# Patient Record
Sex: Female | Born: 1940 | Race: White | Hispanic: No | Marital: Married | Smoking: Never smoker
Health system: Southern US, Community
[De-identification: ages and names within clinical notes are randomized; demographics above are authoritative.]

## PROBLEM LIST (undated history)

## (undated) DIAGNOSIS — N3289 Other specified disorders of bladder: Secondary | ICD-10-CM

## (undated) DIAGNOSIS — R9431 Abnormal electrocardiogram [ECG] [EKG]: Secondary | ICD-10-CM

## (undated) DIAGNOSIS — I10 Essential (primary) hypertension: Secondary | ICD-10-CM

## (undated) DIAGNOSIS — R32 Unspecified urinary incontinence: Secondary | ICD-10-CM

## (undated) DIAGNOSIS — M5416 Radiculopathy, lumbar region: Secondary | ICD-10-CM

## (undated) DIAGNOSIS — Z8619 Personal history of other infectious and parasitic diseases: Secondary | ICD-10-CM

## (undated) DIAGNOSIS — Z79899 Other long term (current) drug therapy: Secondary | ICD-10-CM

## (undated) DIAGNOSIS — M8589 Other specified disorders of bone density and structure, multiple sites: Secondary | ICD-10-CM

## (undated) DIAGNOSIS — K5900 Constipation, unspecified: Secondary | ICD-10-CM

## (undated) DIAGNOSIS — K222 Esophageal obstruction: Secondary | ICD-10-CM

## (undated) DIAGNOSIS — R269 Unspecified abnormalities of gait and mobility: Secondary | ICD-10-CM

## (undated) DIAGNOSIS — K4020 Bilateral inguinal hernia, without obstruction or gangrene, not specified as recurrent: Secondary | ICD-10-CM

## (undated) DIAGNOSIS — M545 Low back pain, unspecified: Secondary | ICD-10-CM

## (undated) DIAGNOSIS — N183 Chronic kidney disease, stage 3 unspecified: Secondary | ICD-10-CM

## (undated) DIAGNOSIS — I499 Cardiac arrhythmia, unspecified: Secondary | ICD-10-CM

## (undated) DIAGNOSIS — G35 Multiple sclerosis: Secondary | ICD-10-CM

## (undated) DIAGNOSIS — N952 Postmenopausal atrophic vaginitis: Secondary | ICD-10-CM

## (undated) DIAGNOSIS — M47816 Spondylosis without myelopathy or radiculopathy, lumbar region: Secondary | ICD-10-CM

## (undated) DIAGNOSIS — G890 Central pain syndrome: Secondary | ICD-10-CM

## (undated) DIAGNOSIS — R002 Palpitations: Secondary | ICD-10-CM

## (undated) HISTORY — PX: CARPAL TUNNEL RELEASE: SHX101

## (undated) HISTORY — PX: TUBAL LIGATION: SHX77

## (undated) HISTORY — PX: SPINE SURGERY: SHX786

## (undated) HISTORY — PX: TONSILLECTOMY: SUR1361

---

## 2016-07-05 ENCOUNTER — Emergency Department
Admission: EM | Admit: 2016-07-05 | Discharge: 2016-07-05 | Disposition: A | Discharge status: Home / Self Care | Payer: Medicare Other | Attending: Emergency Medicine | Admitting: Emergency Medicine

## 2016-07-05 ENCOUNTER — Encounter: Payer: Self-pay | Admitting: Emergency Medicine

## 2016-07-05 ENCOUNTER — Emergency Department: Payer: Medicare Other

## 2016-07-05 DIAGNOSIS — W19XXXA Unspecified fall, initial encounter: Secondary | ICD-10-CM

## 2016-07-05 DIAGNOSIS — Y999 Unspecified external cause status: Secondary | ICD-10-CM | POA: Insufficient documentation

## 2016-07-05 DIAGNOSIS — W1839XA Other fall on same level, initial encounter: Secondary | ICD-10-CM | POA: Diagnosis not present

## 2016-07-05 DIAGNOSIS — Y939 Activity, unspecified: Secondary | ICD-10-CM | POA: Diagnosis not present

## 2016-07-05 DIAGNOSIS — Y92009 Unspecified place in unspecified non-institutional (private) residence as the place of occurrence of the external cause: Secondary | ICD-10-CM

## 2016-07-05 DIAGNOSIS — Y92012 Bathroom of single-family (private) house as the place of occurrence of the external cause: Secondary | ICD-10-CM | POA: Diagnosis not present

## 2016-07-05 DIAGNOSIS — S299XXA Unspecified injury of thorax, initial encounter: Secondary | ICD-10-CM | POA: Diagnosis present

## 2016-07-05 DIAGNOSIS — S20221A Contusion of right back wall of thorax, initial encounter: Secondary | ICD-10-CM | POA: Diagnosis not present

## 2016-07-05 HISTORY — DX: Low back pain, unspecified: M54.50

## 2016-07-05 HISTORY — DX: Low back pain: M54.5

## 2016-07-05 HISTORY — DX: Cardiac arrhythmia, unspecified: I49.9

## 2016-07-05 HISTORY — DX: Other specified disorders of bladder: N32.89

## 2016-07-05 HISTORY — DX: Multiple sclerosis: G35

## 2016-07-05 MED ORDER — HYDROCODONE-ACETAMINOPHEN 5-325 MG PO TABS
2.0000 | ORAL_TABLET | Freq: Once | ORAL | Status: AC
  Administered 2016-07-05: 2 via ORAL
  Filled 2016-07-05: qty 2

## 2016-07-05 NOTE — ED Provider Notes (Signed)
[facility] Emergency Department Provider Note  ____________________________________________  Time seen: Approximately 10:30 AM  I have reviewed the triage vital signs and the nursing notes.   HISTORY  Chief Complaint Fall    HPI Peggy Mclaughlin is a 76 y.o. female , NAD, presents to the emergency department accompanied by her son who assists with history. Patient states she fell backwards onto her toilet this morning. States she has multiple sclerosis and chronic lower back pain which causes her legs to be weak. Unfortunately did not have safety precautions in place in her current bathroom as she just recently moved in to a new apartment over the last couple of days. Denies head injury, loss of consciousness, visual changes, numbness or tingling. Did not hit her head and denies any neck pain at this time. States she hit the right middle back and shoulder area onto the edge of the wound. Has not noted any bruising, swelling or bleeding. Her son states that she has had no slurred speech or changes in gait since being in his presence.She is on chronic pain medication as prescribed by pain management Center in [facility] but did not take any pain medication this morning. Denies any saddle paresthesias no loss of bowel or bladder control. Denies chest pain, shortness breath, abdominal pain, nausea or vomiting.   Past Medical History:  Diagnosis Date  . Bladder spasms   . Irregular heart beat   . Lower back pain   . Multiple sclerosis (HCC)     There are no active problems to display for this patient.   Past Surgical History:  Procedure Laterality Date  . CARPAL TUNNEL RELEASE    . SPINE SURGERY     lumbar and thoracic  . TONSILLECTOMY    . TUBAL LIGATION      Prior to Admission medications   Not on File    Allergies Aspirin; Demerol [meperidine]; and Adhesive [tape]  No family history on file.  Social History Social History   Substance Use Topics  . Smoking status: Never Smoker  . Smokeless tobacco: Never Used  . Alcohol use No     Review of Systems  Constitutional: No fever/chills Eyes: No visual changes.  Cardiovascular: No chest pain. Respiratory: No shortness of breath. No wheezing.  Gastrointestinal: No abdominal pain.  No nausea, vomiting.  Musculoskeletal:Positive for right-sided middle back pain. Negative for neck or lower back pain. Skin: Negative for rash, Redness, swelling, bruising, open wounds or lacerations. Neurological: Negative for headaches, focal weakness or numbness.No tingling. No loss of consciousness, dizziness, lightheadedness. No saddle paresthesias or loss of bowel or bladder control 10-point ROS otherwise negative.  ____________________________________________   PHYSICAL EXAM:  VITAL SIGNS: ED Triage Vitals  Enc Vitals Group     BP 07/05/16 0931 132/75     Pulse Rate 07/05/16 0931 (!) 49     Resp 07/05/16 0931 20     Temp 07/05/16 0931 97.7 F (36.5 C)     Temp Source 07/05/16 0931 Oral     SpO2 07/05/16 0931 98 %     Weight 07/05/16 0931 195 lb (88.5 kg)     Height 07/05/16 0931 5\' 4"  (1.626 m)     Head Circumference --      Peak Flow --      Pain Score 07/05/16 0953 9     Pain Loc --      Pain Edu? --      Excl. in GC? --  Constitutional: Alert and oriented. Well appearing and in no acute distress. Eyes: Conjunctivae are normal.  Head: Atraumatic. Neck: Supple with full range of motion. No cervical spine tenderness to palpation. No trapezial muscle spasms. Hematological/Lymphatic/Immunilogical: No cervical lymphadenopathy. Cardiovascular: Normal rate, regular rhythm. Normal S1 and S2.  Good peripheral circulation. Respiratory: Normal respiratory effort without tachypnea or retractions. Lungs CTAB with breath sounds noted in all lung fields. No wheeze, rhonchi, rales Musculoskeletal: Tenderness to palpation about the right scapula without any bony  abnormalities or crepitus. Full range of motion of the right upper extremity without difficulty. Tenderness to palpation about the right, mid posterior rib cage without crepitus or bony abnormality. No midline thoracic or lumbar spinal tenderness. No lower extremity tenderness nor edema.  No joint effusions. Neurologic:  Normal speech and language. No gross focal neurologic deficits are appreciated.  Skin:  Skin is warm, dry and intact. No rash, redness, swelling, bruising, open wounds or lacerations noted. Psychiatric: Mood and affect are normal. Speech and behavior are normal. Patient exhibits appropriate insight and judgement.   ____________________________________________   LABS  None ____________________________________________  EKG  None ____________________________________________  RADIOLOGY I, Hope Pigeon, personally viewed and evaluated these images (plain radiographs) as part of my medical decision making, as well as reviewing the written report by the radiologist.  Dg Ribs Unilateral W/chest Right  Result Date: 07/05/2016 CLINICAL DATA:  Right rib pain after fall at home. EXAM: RIGHT RIBS AND CHEST - 3+ VIEW COMPARISON:  None. FINDINGS: No fracture or other bone lesions are seen involving the ribs. There is no evidence of pneumothorax or pleural effusion. Both lungs are clear. Heart size and mediastinal contours are within normal limits. IMPRESSION: Normal right ribs.  No acute cardiopulmonary abnormality seen. Electronically Signed   By: Lupita Raider, M.D.   On: 07/05/2016 11:22   Dg Scapula Right  Result Date: 07/05/2016 CLINICAL DATA:  Right scapula pain after fall at home today. EXAM: RIGHT SCAPULA - 2+ VIEWS COMPARISON:  None. FINDINGS: There is no evidence of fracture or other focal bone lesions. Soft tissues are unremarkable. IMPRESSION: Normal right scapula. Electronically Signed   By: Lupita Raider, M.D.   On: 07/05/2016 11:24     ____________________________________________    PROCEDURES  Procedure(s) performed: None   Procedures   Medications  HYDROcodone-acetaminophen (NORCO/VICODIN) 5-325 MG per tablet 2 tablet (2 tablets Oral Given 07/05/16 1215)     ____________________________________________   INITIAL IMPRESSION / ASSESSMENT AND PLAN / ED COURSE  Pertinent labs & imaging results that were available during my care of the patient were reviewed by me and considered in my medical decision making (see chart for details).  Clinical Course as of Jul 06 1411  Thu Jul 05, 2016  1041 Patient changed into a gown and transferred from wheelchair to bed.   [JH]  1050 Patient transported to xray   [JH]  [address] [place] Controlled substance database accessed and reviewed. The patient receives #60 tablets of Norco 10 mg monthly from [place], [place]. Last prescription was dispensed on 12/29/ 2017 to last for 30 days.  [JH]    Clinical Course User Index [JH] Shantese Raven L Basya Casavant, PA-C    Patient's diagnosis is consistent with Contusion of the right back due to falling at home. Patient's son at the bedside states that they're the process of applying handrails for the patient using a new home. Patient does have a bedside urinal in which she can use over the next few  days to limit significant ambulation. Patient advised to complete light range of motion and stretching as tolerated. Patient advised against any complete rest and should move about her home as she feels comfortable. Patient will be discharged home with instructions to take over-the-counter Tylenol arthritis strength in conjunction with her chronic pain medication as needed. Patient advised against taking any more than 2400-3000 mg of Tylenol in a 24-hour period and should count the acetaminophen that is in her chronic pain medications. Patient may contact her medical provider at the pain management Center to discuss any increase in acute pain management. Patient also  given information for [facility] in which she can call to schedule an appointment to establish care for chronic medical needs.  Patient is given ED precautions to return to the ED for any worsening or new symptoms.    ____________________________________________  FINAL CLINICAL IMPRESSION(S) / ED DIAGNOSES  Final diagnoses:  Contusion, back, right, initial encounter  Fall in home, initial encounter      NEW MEDICATIONS STARTED DURING THIS VISIT:  There are no discharge medications for this patient.        Hope Pigeon, PA-C 07/05/16 1413    Emily Filbert, MD 07/05/16 [phone]

## 2016-07-05 NOTE — ED Triage Notes (Addendum)
Fell in bathroom this morning, hit right mid back on side of commode.  C/O right back pain.  Patient states she and her husband recently moved into home and have scheduled installation of handicap bars.  Patient has history of MS.

## 2016-07-05 NOTE — ED Notes (Addendum)
See triage note   States she fell while getting off commode  Hit right mid back   Denies any other injury

## 2016-07-05 NOTE — ED Notes (Signed)
Patient waiting on ride with her clothes to be discharged.

## 2016-08-11 ENCOUNTER — Ambulatory Visit
Admission: EM | Admit: 2016-08-11 | Discharge: 2016-08-11 | Disposition: A | Discharge status: Home / Self Care | Payer: Medicare Other | Attending: Family Medicine | Admitting: Family Medicine

## 2016-08-11 ENCOUNTER — Encounter: Payer: Self-pay | Admitting: *Deleted

## 2016-08-11 DIAGNOSIS — Z882 Allergy status to sulfonamides status: Secondary | ICD-10-CM | POA: Insufficient documentation

## 2016-08-11 DIAGNOSIS — Z880 Allergy status to penicillin: Secondary | ICD-10-CM | POA: Insufficient documentation

## 2016-08-11 DIAGNOSIS — R32 Unspecified urinary incontinence: Secondary | ICD-10-CM | POA: Insufficient documentation

## 2016-08-11 DIAGNOSIS — G35 Multiple sclerosis: Secondary | ICD-10-CM | POA: Insufficient documentation

## 2016-08-11 DIAGNOSIS — N390 Urinary tract infection, site not specified: Secondary | ICD-10-CM | POA: Insufficient documentation

## 2016-08-11 LAB — URINALYSIS, COMPLETE (UACMP) WITH MICROSCOPIC
Bilirubin Urine: NEGATIVE
GLUCOSE, UA: NEGATIVE mg/dL
HGB URINE DIPSTICK: NEGATIVE
Ketones, ur: NEGATIVE mg/dL
Nitrite: POSITIVE — AB
PH: 7 (ref 5.0–8.0)
Protein, ur: NEGATIVE mg/dL
RBC / HPF: NONE SEEN RBC/hpf (ref 0–5)
Specific Gravity, Urine: 1.015 (ref 1.005–1.030)

## 2016-08-11 MED ORDER — CIPROFLOXACIN HCL 500 MG PO TABS: 500.0000 mg | ORAL_TABLET | Freq: Two times a day (BID) | ORAL | 0 refills | Status: AC

## 2016-08-11 NOTE — Discharge Instructions (Signed)
Take medication as prescribed. Rest. Drink plenty of fluids.   Follow up with your primary care physician this week as discussed. Follow up with your urologist. Return to Urgent care for new or worsening concerns.

## 2016-08-11 NOTE — ED Triage Notes (Signed)
Patient started having symptoms of burning on urination, urinary incontinence, and urine odor for 1.5 weeks. Patient has a history of UTI.

## 2016-08-11 NOTE — ED Provider Notes (Signed)
[facility] ____________________________________________  Time seen: Approximately 1150 am  I have reviewed the triage vital signs and the nursing notes.   HISTORY  Chief Complaint Urinary Incontinence   HPI Peggy Mclaughlin is a 76 y.o. female presenting for evaluation of foul smelling urine, burning with urination, urinary frequency and occasional urinary incontinence for the last week. Patient reports similar presentation and past with urinary tract infections. Patient reports that she does have a history of MS, and reports is often in wheelchair. Reports she has been having similar symptoms of UTIs that have increased in frequency over the last year. Denies known triggers. Reports has been previously seen by urology.   Reports last urinary tract infection was approximately 2 months ago. In discussing with patient, care everywhere reviewed and noted that patient did have a positive UTI with culture showing Klebsiella that was resistant to ampicillin and nitrofurantoin. Urine culture at that time showed sensitivities to oral treatment Cipro, cephalexin, Bactrim. Patient reports penicillin and sulfa allergic. Patient reports tongue issues when taking penicillins, Denies tongue swelling.   patient reports that she has chronic low back pain, denies acute changes. Reports continues to eat and drink well. Denies nausea, vomiting, diarrhea, constipation, vaginal complaints. Patient reports last creatinine was 1.3.    Past Medical History:  Diagnosis Date  . Bladder spasms   . Irregular heart beat   . Lower back pain   . Multiple sclerosis (HCC)     There are no active problems to display for this patient.   Past Surgical History:  Procedure Laterality Date  . CARPAL TUNNEL RELEASE    . SPINE SURGERY     lumbar and thoracic  . TONSILLECTOMY    . TUBAL LIGATION       No current facility-administered medications for this encounter.   Current Outpatient  Prescriptions:  .  benazepril (LOTENSIN) 40 MG tablet, Take 40 mg by mouth daily., Disp: , Rfl:  .  FLUoxetine (PROZAC) 20 MG tablet, Take 20 mg by mouth daily., Disp: , Rfl:  .  fluticasone (FLONASE) 50 MCG/ACT nasal spray, Place 2 sprays into both nostrils daily., Disp: , Rfl:  .  HYDROcodone-acetaminophen (NORCO) 10-325 MG tablet, Take 1 tablet by mouth daily after breakfast., Disp: , Rfl:  .  hyoscyamine (LEVSIN, ANASPAZ) 0.125 MG tablet, Take 0.125 mg by mouth daily., Disp: , Rfl:  .  spironolactone (ALDACTONE) 25 MG tablet, Take 25 mg by mouth daily., Disp: , Rfl:  .  ciprofloxacin (CIPRO) 500 MG tablet, Take 1 tablet (500 mg total) by mouth 2 (two) times daily., Disp: 14 tablet, Rfl: 0 .  estradiol (ESTRACE) 0.1 MG/GM vaginal cream, Place 1 Applicatorful vaginally at bedtime., Disp: , Rfl:   Allergies Amlodipine; Ampicillin; Sulfa antibiotics; Aspirin; Demerol [meperidine]; Adhesive [tape]; Baclofen; Simvastatin; and Tapentadol  History reviewed. No pertinent family history.  Social History Social History  Substance Use Topics  . Smoking status: Never Smoker  . Smokeless tobacco: Never Used  . Alcohol use No    Review of Systems Constitutional: No fever/chills Cardiovascular: Denies chest pain. Respiratory: Denies shortness of breath. Gastrointestinal: No abdominal pain.  No nausea, no vomiting.  No diarrhea.  No constipation. Genitourinary: Positive for dysuria. Musculoskeletal: As above.  Skin: Negative for rash. Neurological: Negative for focal weakness or numbness.  10-point ROS otherwise negative.  ____________________________________________   PHYSICAL EXAM:  VITAL SIGNS: ED Triage Vitals  Enc Vitals Group     BP 08/11/16 1048 136/88  Pulse Rate 08/11/16 1048 60     Resp 08/11/16 1048 16     Temp 08/11/16 1048 98.3 F (36.8 C)     Temp Source 08/11/16 1048 Oral     SpO2 08/11/16 1048 99 %     Weight 08/11/16 1049 190 lb (86.2 kg)     Height 08/11/16  1049 5\' 4"  (1.626 m)     Head Circumference --      Peak Flow --      Pain Score 08/11/16 1057 0     Pain Loc --      Pain Edu? --      Excl. in GC? --     Constitutional: Alert and oriented. Well appearing and in no acute distress. Eyes: Conjunctivae are normal. PERRL. EOMI. ENT      Head: Normocephalic and atraumatic. Hematological/Lymphatic/Immunilogical: No cervical lymphadenopathy. Cardiovascular: Normal rate, regular rhythm. Grossly normal heart sounds.  Good peripheral circulation. Respiratory: Normal respiratory effort without tachypnea nor retractions. Breath sounds are clear and equal bilaterally. No wheezes, rales, rhonchi. Gastrointestinal: Soft and nontender. No distention. No CVA tenderness. Neurologic:  Normal speech and language. Speech is normal.  Skin:  Skin is warm, dry Psychiatric: Mood and affect are normal. Speech and behavior are normal. Patient exhibits appropriate insight and judgment   ___________________________________________   LABS (all labs ordered are listed, but only abnormal results are displayed)  Labs Reviewed  URINALYSIS, COMPLETE (UACMP) WITH MICROSCOPIC - Abnormal; Notable for the following:       Result Value   Color, Urine STRAW (*)    APPearance HAZY (*)    Nitrite POSITIVE (*)    Leukocytes, UA TRACE (*)    Squamous Epithelial / LPF 6-30 (*)    Bacteria, UA MANY (*)    All other components within normal limits  URINE CULTURE     PROCEDURES Procedures    INITIAL IMPRESSION / ASSESSMENT AND PLAN / ED COURSE  Pertinent labs & imaging results that were available during my care of the patient were reviewed by me and considered in my medical decision making (see chart for details).  Well-appearing patient. No acute distress. Urinalysis reviewed, suspect urinary tract infection. In discussing patient interview and most recent urine culture sensitivities, discussed with patient we'll treat patient with oral Cipro, as patient denies  previously taking cephalexin and unsure as far as tongue involvement with penicillin allergy. Will culture urine. Discussed in detail patient supportive care as well as follow-up with primary care physician or urology in the next week. Discussed strict follow-up and return parameters. Discussed indication, risks (including tendon issues) and benefits of medications with patient.  Discussed follow up with Primary care physician this week. Discussed follow up and return parameters including no resolution or any worsening concerns. Patient verbalized understanding and agreed to plan.   ____________________________________________   FINAL CLINICAL IMPRESSION(S) / ED DIAGNOSES  Final diagnoses:  Urinary tract infection without hematuria, site unspecified     Discharge Medication List as of 08/11/2016 12:10 PM    START taking these medications   Details  ciprofloxacin (CIPRO) 500 MG tablet Take 1 tablet (500 mg total) by mouth 2 (two) times daily., Starting Sat 08/11/2016, Until Sat 08/18/2016, Normal        Note: This dictation was prepared with Dragon dictation along with smaller phrase technology. Any transcriptional errors that result from this process are unintentional.         Renford Dills, NP 08/11/16 1255

## 2016-08-14 LAB — URINE CULTURE
Culture: >=100000 — AB
SPECIAL REQUESTS: NORMAL

## 2017-04-22 ENCOUNTER — Observation Stay
Admission: EM | Admit: 2017-04-22 | Discharge: 2017-04-23 | Disposition: A | Discharge status: Home / Self Care | Payer: Medicare Other | Attending: Internal Medicine | Admitting: Internal Medicine

## 2017-04-22 ENCOUNTER — Other Ambulatory Visit: Payer: Self-pay

## 2017-04-22 ENCOUNTER — Emergency Department: Payer: Medicare Other

## 2017-04-22 ENCOUNTER — Encounter: Payer: Self-pay | Admitting: *Deleted

## 2017-04-22 DIAGNOSIS — F329 Major depressive disorder, single episode, unspecified: Secondary | ICD-10-CM | POA: Diagnosis not present

## 2017-04-22 DIAGNOSIS — I10 Essential (primary) hypertension: Secondary | ICD-10-CM | POA: Diagnosis present

## 2017-04-22 DIAGNOSIS — I129 Hypertensive chronic kidney disease with stage 1 through stage 4 chronic kidney disease, or unspecified chronic kidney disease: Principal | ICD-10-CM | POA: Insufficient documentation

## 2017-04-22 DIAGNOSIS — I499 Cardiac arrhythmia, unspecified: Secondary | ICD-10-CM | POA: Diagnosis not present

## 2017-04-22 DIAGNOSIS — Z888 Allergy status to other drugs, medicaments and biological substances status: Secondary | ICD-10-CM | POA: Insufficient documentation

## 2017-04-22 DIAGNOSIS — N183 Chronic kidney disease, stage 3 unspecified: Secondary | ICD-10-CM | POA: Diagnosis present

## 2017-04-22 DIAGNOSIS — Z880 Allergy status to penicillin: Secondary | ICD-10-CM | POA: Insufficient documentation

## 2017-04-22 DIAGNOSIS — R51 Headache: Secondary | ICD-10-CM

## 2017-04-22 DIAGNOSIS — N3289 Other specified disorders of bladder: Secondary | ICD-10-CM | POA: Insufficient documentation

## 2017-04-22 DIAGNOSIS — Z882 Allergy status to sulfonamides status: Secondary | ICD-10-CM | POA: Diagnosis not present

## 2017-04-22 DIAGNOSIS — Z79899 Other long term (current) drug therapy: Secondary | ICD-10-CM | POA: Diagnosis not present

## 2017-04-22 DIAGNOSIS — Z885 Allergy status to narcotic agent status: Secondary | ICD-10-CM | POA: Diagnosis not present

## 2017-04-22 DIAGNOSIS — Z886 Allergy status to analgesic agent status: Secondary | ICD-10-CM | POA: Diagnosis not present

## 2017-04-22 DIAGNOSIS — G35 Multiple sclerosis: Secondary | ICD-10-CM | POA: Diagnosis not present

## 2017-04-22 DIAGNOSIS — R519 Headache, unspecified: Secondary | ICD-10-CM | POA: Diagnosis present

## 2017-04-22 DIAGNOSIS — F32A Depression, unspecified: Secondary | ICD-10-CM | POA: Diagnosis present

## 2017-04-22 LAB — BASIC METABOLIC PANEL
ANION GAP: 10 (ref 5–15)
BUN: 21 mg/dL — ABNORMAL HIGH (ref 6–20)
CALCIUM: 9.2 mg/dL (ref 8.9–10.3)
CO2: 26 mmol/L (ref 22–32)
CREATININE: 1.2 mg/dL — AB (ref 0.44–1.00)
Chloride: 99 mmol/L — ABNORMAL LOW (ref 101–111)
GFR calc Af Amer: 50 mL/min — ABNORMAL LOW (ref >60)
GFR calc non Af Amer: 43 mL/min — ABNORMAL LOW (ref >60)
GLUCOSE: 99 mg/dL (ref 65–99)
Potassium: 3.6 mmol/L (ref 3.5–5.1)
Sodium: 135 mmol/L (ref 135–145)

## 2017-04-22 LAB — URINALYSIS, COMPLETE (UACMP) WITH MICROSCOPIC
Bacteria, UA: NONE SEEN
Bilirubin Urine: NEGATIVE
GLUCOSE, UA: NEGATIVE mg/dL
Ketones, ur: NEGATIVE mg/dL
Leukocytes, UA: NEGATIVE
Nitrite: NEGATIVE
Protein, ur: NEGATIVE mg/dL
SPECIFIC GRAVITY, URINE: 1.013 (ref 1.005–1.030)
pH: 6 (ref 5.0–8.0)

## 2017-04-22 LAB — CBC
HCT: 39.3 % (ref 35.0–47.0)
Hemoglobin: 13.3 g/dL (ref 12.0–16.0)
MCH: 31.6 pg (ref 26.0–34.0)
MCHC: 33.7 g/dL (ref 32.0–36.0)
MCV: 93.7 fL (ref 80.0–100.0)
PLATELETS: 203 10*3/uL (ref 150–440)
RBC: 4.19 MIL/uL (ref 3.80–5.20)
RDW: 12.5 % (ref 11.5–14.5)
WBC: 6.1 10*3/uL (ref 3.6–11.0)

## 2017-04-22 MED ORDER — FLUOXETINE HCL 20 MG PO CAPS
20.0000 mg | ORAL_CAPSULE | Freq: Every day | ORAL | Status: DC
  Administered 2017-04-22: 20 mg via ORAL
  Filled 2017-04-22: qty 1

## 2017-04-22 MED ORDER — FLUOXETINE HCL 20 MG PO CAPS
ORAL_CAPSULE | ORAL | Status: AC
  Filled 2017-04-22: qty 1

## 2017-04-22 MED ORDER — HYDRALAZINE HCL 20 MG/ML IJ SOLN
5.0000 mg | Freq: Once | INTRAMUSCULAR | Status: AC
  Administered 2017-04-22: 5 mg via INTRAVENOUS
  Filled 2017-04-22: qty 1

## 2017-04-22 MED ORDER — LABETALOL HCL 5 MG/ML IV SOLN
10.0000 mg | Freq: Once | INTRAVENOUS | Status: AC
  Administered 2017-04-22: 10 mg via INTRAVENOUS
  Filled 2017-04-22: qty 4

## 2017-04-22 MED ORDER — CARVEDILOL 6.25 MG PO TABS
3.1250 mg | ORAL_TABLET | ORAL | Status: AC
  Administered 2017-04-22: 3.125 mg via ORAL
  Filled 2017-04-22: qty 1

## 2017-04-22 NOTE — ED Notes (Signed)
Pt unable to void at this time. 

## 2017-04-22 NOTE — ED Provider Notes (Addendum)
[facility] Emergency Department Provider Note  ____________________________________________  Time seen: Approximately 5:59 PM  I have reviewed the triage vital signs and the nursing notes.   HISTORY  Chief Complaint Headache    HPI Peggy Mclaughlin is a 76 y.o. female who complains of bilateral generalized headache for the past 3 weeks, no aggravating or alleviating factors, no neck pain, fevers chills or neck stiffness. No vision changes. Moderate intensity, throbbing.  Also over the last 10 days she's noticed that her blood pressure is very high, partially 190/100. Her orthopedist sent her to her primary care doctor who increased her medicines a week ago, but she is still having high blood pressure. She's been compliant with her meds and not run out of anything. Denies chest pain shortness of breath back pain. No abdominal pain. No dizziness or syncope. Eating and drinking normally.  Has a history of recurrent UTIs due to MS.     Past Medical History:  Diagnosis Date  . Bladder spasms   . Irregular heart beat   . Lower back pain   . Multiple sclerosis (HCC)      There are no active problems to display for this patient.    Past Surgical History:  Procedure Laterality Date  . CARPAL TUNNEL RELEASE    . SPINE SURGERY     lumbar and thoracic  . TONSILLECTOMY    . TUBAL LIGATION       Prior to Admission medications   Medication Sig Start Date End Date Taking? Authorizing Provider  benazepril (LOTENSIN) 40 MG tablet Take 40 mg by mouth daily.   Yes [provider]  carvedilol (COREG) 3.125 MG tablet Take 1 tablet 2 (two) times daily by mouth. 04/03/17 04/03/18 Yes [provider]  estradiol (ESTRACE) 0.1 MG/GM vaginal cream Place 1 Applicatorful vaginally at bedtime.   Yes [provider]  FLUoxetine (PROZAC) 20 MG tablet Take 20 mg 2 (two) times daily by mouth.    Yes [provider]  fluticasone  (FLONASE) 50 MCG/ACT nasal spray Place 2 sprays into both nostrils daily.   Yes [provider]  HYDROcodone-acetaminophen (NORCO) 10-325 MG tablet Take 1 tablet by mouth daily after breakfast.   Yes [provider]  hyoscyamine (LEVSIN, ANASPAZ) 0.125 MG tablet Take 0.125 mg by mouth daily.   Yes [provider]  OXcarbazepine (TRILEPTAL) 150 MG tablet Take 1 tablet 2 (two) times daily by mouth. 02/21/17 02/21/18 Yes [provider]  spironolactone (ALDACTONE) 25 MG tablet Take 25 mg by mouth daily.   Yes [provider]  acetaminophen (TYLENOL) 500 MG tablet Take 1 tablet every 8 (eight) hours as needed by mouth.    [provider]  diclofenac sodium (VOLTAREN) 1 % GEL Apply 4 g 3 (three) times daily topically.    [provider]     Allergies Amlodipine; Ampicillin; Sulfa antibiotics; Aspirin; Demerol [meperidine]; Adhesive [tape]; Baclofen; Simvastatin; and Tapentadol   No family history on file.  Social History Social History   Tobacco Use  . Smoking status: Never Smoker  . Smokeless tobacco: Never Used  Substance Use Topics  . Alcohol use: No  . Drug use: No    Review of Systems  Constitutional:   No fever or chills.  ENT:   No sore throat. No rhinorrhea. Cardiovascular:   No chest pain or syncope. Respiratory:   No dyspnea or cough. Gastrointestinal:   Negative for abdominal pain, vomiting and diarrhea.  Musculoskeletal:  Negative for focal pain or swelling All other systems reviewed and are negative except as documented above in ROS and HPI.  ____________________________________________   PHYSICAL EXAM:  VITAL SIGNS: ED Triage Vitals  Enc Vitals Group     BP 04/22/17 1640 (!) 193/113     Pulse Rate 04/22/17 1640 79     Resp 04/22/17 1640 20     Temp 04/22/17 1640 98.6 F (37 C)     Temp Source 04/22/17 1640 Oral     SpO2 04/22/17 1640 99 %     Weight 04/22/17 1644 196 lb (88.9 kg)     Height  04/22/17 1644 5\' 4"  (1.626 m)     Head Circumference --      Peak Flow --      Pain Score 04/22/17 1640 8     Pain Loc --      Pain Edu? --      Excl. in GC? --     Vital signs reviewed, nursing assessments reviewed.   Constitutional:   Alert and oriented. Well appearing and in no distress. Eyes:   No scleral icterus.  EOMI. No nystagmus. No conjunctival pallor. PERRL. ENT   Head:   Normocephalic and atraumatic.   Nose:   No congestion/rhinnorhea.    Mouth/Throat:   MMM, no pharyngeal erythema. No peritonsillar mass.    Neck:   No meningismus. Full ROM. Hematological/Lymphatic/Immunilogical:   No cervical lymphadenopathy. Cardiovascular:   RRR. Symmetric bilateral radial and DP pulses.  No murmurs.  Respiratory:   Normal respiratory effort without tachypnea/retractions. Breath sounds are clear and equal bilaterally. No wheezes/rales/rhonchi. Gastrointestinal:   Soft and nontender. Non distended. There is no CVA tenderness.  No rebound, rigidity, or guarding. Genitourinary:   deferred Musculoskeletal:   Normal range of motion in all extremities. No joint effusions.  No lower extremity tenderness.  No edema. Neurologic:   Normal speech and language.  Normal finger to nose. Right-sided drift which is chronic and baseline according to the patient and daughter. 4-5 strength on the right side which is chronic due to AMS. Full strength on the left. Normal sensation. Motor grossly intact. No acute focal neurologic deficits are appreciated.  Skin:    Skin is warm, dry and intact. No rash noted.  No petechiae, purpura, or bullae.  ____________________________________________    LABS (pertinent positives/negatives) (all labs ordered are listed, but only abnormal results are displayed) Labs Reviewed  BASIC METABOLIC PANEL - Abnormal; Notable for the following components:      Result Value   Chloride 99 (*)    BUN 21 (*)    Creatinine, Ser 1.20 (*)    GFR calc non Af Amer 43  (*)    GFR calc Af Amer 50 (*)    All other components within normal limits  URINALYSIS, COMPLETE (UACMP) WITH MICROSCOPIC - Abnormal; Notable for the following components:   Color, Urine YELLOW (*)    APPearance CLEAR (*)    Hgb urine dipstick MODERATE (*)    Squamous Epithelial / LPF 0-5 (*)    All other components within normal limits  URINE CULTURE  CBC   ____________________________________________   EKG    ____________________________________________    RADIOLOGY  Ct Head Wo Contrast  Result Date: 04/22/2017 CLINICAL DATA:  Hypertension over the past 10 days with headache over the past 3 weeks. EXAM: CT HEAD WITHOUT CONTRAST TECHNIQUE: Contiguous axial images were obtained from the base of the skull through the vertex without intravenous contrast.  COMPARISON:  None. FINDINGS: Brain: Moderate degree of periventricular, subcortical and deep white matter hypodensity consistent with chronic microvascular ischemia. No large vascular territory infarction, hemorrhage or midline shift. No hydrocephalus. 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 No effacement of the fourth ventricle nor basal cisterns. No intra-axial mass. Vascular: No hyperdense vessels. Moderate atherosclerosis of the carotid siphons. Skull: No acute fracture or suspicious osseous lesions. Sinuses/Orbits: No acute finding. Other: None. IMPRESSION: Moderate degree of chronic appearing small vessel ischemia involving the periventricular and subcortical white matter. No acute intracranial abnormality. Electronically Signed   By: Tollie Eth M.D.   On: 04/22/2017 18:51    ____________________________________________   PROCEDURES Procedures CRITICAL CARE Performed by: Scotty Court, Cordelro Gautreau   Total critical care time: 35 minutes  Critical care time was exclusive of separately billable procedures and treating other patients.  Critical care was necessary to treat or prevent imminent or life-threatening  deterioration.  Critical care was time spent personally by me on the following activities: development of treatment plan with patient and/or surrogate as well as nursing, discussions with consultants, evaluation of patient's response to treatment, examination of patient, obtaining history from patient or surrogate, ordering and performing treatments and interventions, ordering and review of laboratory studies, ordering and review of radiographic studies, pulse oximetry and re-evaluation of patient's condition.  ____________________________________________   DIFFERENTIAL DIAGNOSIS  Intracerebral hemorrhage, subdural, symptomatic hypertension, urinary tract infection  CLINICAL IMPRESSION / ASSESSMENT AND PLAN / ED COURSE  Pertinent labs & imaging results that were available during my care of the patient were reviewed by me and considered in my medical decision making (see chart for details).   Patient well-appearing no acute distress, presents with hypertension of 190/100, otherwise normal vital signs. This is subacute to chronic, going on for several weeks. No trauma, no acute neurologic symptoms other than her headache. No new motor or sensory deficits or vision changes. She does have MS and frequent UTIs.  Check a UA, CT head. Get some medication to gently lower her blood pressure in the ED. If workup is negative for intracranial hemorrhage and symptoms are controlled to think the patient could be suitable for outpatient follow-up. Monitor response to treatment.  Clinical Course as of Apr 23 2  Mon Apr 22, 2017  1905 Ua neg, ct head neg.  Will control bp, reassess  [PS]  2054 Persistent headache. Give home coreg, repeat hydralazine iv.   [PS]  2209 Still 190/80. Iv labetalol.   [PS]    Clinical Course User Index [PS] Sharman Cheek, MD     ----------------------------------------- 12:03 AM on 04/23/2017 -----------------------------------------  Blood pressure somewhat  improved to 160/100, still severe headache. Discussed with hospitalist to observe the patient in the hospital overnight for continued blood pressure control.  ____________________________________________   FINAL CLINICAL IMPRESSION(S) / ED DIAGNOSES    Final diagnoses:  Bad headache  Uncontrolled hypertension      This SmartLink is deprecated. Use AVSMEDLIST instead to display the medication list for a patient.   Portions of this note were generated with dragon dictation software. Dictation errors may occur despite best attempts at proofreading.    Sharman Cheek, MD 04/23/17 Salley Hews    Sharman Cheek, MD 04/23/17 [phone]

## 2017-04-22 NOTE — ED Triage Notes (Signed)
Pt brought in via ems from home with a headache for 3 weeks and elevated blood pressure for 10 days   Pt alert and orinted on arrival   Iv in place

## 2017-04-22 NOTE — ED Notes (Signed)
Pt waiting to see er md.  Family with pt 

## 2017-04-22 NOTE — ED Notes (Signed)
ED Provider at bedside. 

## 2017-04-22 NOTE — ED Notes (Signed)
meds given again for blood pressure

## 2017-04-22 NOTE — ED Notes (Signed)
Pt reports a headache for 3 weeks and high blood pressure for 10 days.  Pt is taking blood pressure meds every day.  No chest pain or sob.  Pt alert   Speech clear.  Pt taking pain meds for chronic back pain and tylenol for headache.

## 2017-04-22 NOTE — ED Notes (Signed)
Pt refusing blood pressure cuff.  Pt also states  After changing her earlier,pt's pull up was in place.  Pt thought no diaper was applied.

## 2017-04-22 NOTE — ED Notes (Signed)
Scanner for bracelet not working approp.  meds given for increased heart rate.  md in with pt.

## 2017-04-23 ENCOUNTER — Other Ambulatory Visit: Payer: Self-pay

## 2017-04-23 ENCOUNTER — Encounter: Payer: Self-pay | Admitting: Internal Medicine

## 2017-04-23 DIAGNOSIS — N183 Chronic kidney disease, stage 3 unspecified: Secondary | ICD-10-CM | POA: Diagnosis present

## 2017-04-23 DIAGNOSIS — I129 Hypertensive chronic kidney disease with stage 1 through stage 4 chronic kidney disease, or unspecified chronic kidney disease: Secondary | ICD-10-CM | POA: Diagnosis not present

## 2017-04-23 DIAGNOSIS — F329 Major depressive disorder, single episode, unspecified: Secondary | ICD-10-CM | POA: Diagnosis present

## 2017-04-23 DIAGNOSIS — R519 Headache, unspecified: Secondary | ICD-10-CM | POA: Diagnosis present

## 2017-04-23 DIAGNOSIS — F32A Depression, unspecified: Secondary | ICD-10-CM | POA: Diagnosis present

## 2017-04-23 DIAGNOSIS — I10 Essential (primary) hypertension: Secondary | ICD-10-CM | POA: Diagnosis present

## 2017-04-23 DIAGNOSIS — R51 Headache: Secondary | ICD-10-CM

## 2017-04-23 LAB — CBC
HEMATOCRIT: 39.7 % (ref 35.0–47.0)
HEMOGLOBIN: 13.5 g/dL (ref 12.0–16.0)
MCH: 31.7 pg (ref 26.0–34.0)
MCHC: 34 g/dL (ref 32.0–36.0)
MCV: 93.3 fL (ref 80.0–100.0)
Platelets: 207 10*3/uL (ref 150–440)
RBC: 4.25 MIL/uL (ref 3.80–5.20)
RDW: 12.9 % (ref 11.5–14.5)
WBC: 7.5 10*3/uL (ref 3.6–11.0)

## 2017-04-23 LAB — BASIC METABOLIC PANEL
ANION GAP: 8 (ref 5–15)
BUN: 18 mg/dL (ref 6–20)
CALCIUM: 9.3 mg/dL (ref 8.9–10.3)
CO2: 25 mmol/L (ref 22–32)
Chloride: 102 mmol/L (ref 101–111)
Creatinine, Ser: 1.03 mg/dL — ABNORMAL HIGH (ref 0.44–1.00)
GFR calc Af Amer: >60 mL/min (ref >60)
GFR calc non Af Amer: 52 mL/min — ABNORMAL LOW (ref >60)
GLUCOSE: 112 mg/dL — AB (ref 65–99)
POTASSIUM: 3.7 mmol/L (ref 3.5–5.1)
Sodium: 135 mmol/L (ref 135–145)

## 2017-04-23 MED ORDER — OXCARBAZEPINE 150 MG PO TABS
150.0000 mg | ORAL_TABLET | Freq: Two times a day (BID) | ORAL | Status: DC
  Administered 2017-04-23 (×2): 150 mg via ORAL
  Filled 2017-04-23 (×3): qty 1

## 2017-04-23 MED ORDER — HYDRALAZINE HCL 50 MG PO TABS
25.0000 mg | ORAL_TABLET | Freq: Three times a day (TID) | ORAL | Status: DC
  Administered 2017-04-23 (×2): 25 mg via ORAL
  Filled 2017-04-23 (×2): qty 1

## 2017-04-23 MED ORDER — LABETALOL HCL 5 MG/ML IV SOLN: 10.0000 mg | INTRAVENOUS | Status: DC | PRN

## 2017-04-23 MED ORDER — HYOSCYAMINE SULFATE 0.125 MG PO TBDP
0.1250 mg | ORAL_TABLET | Freq: Every day | ORAL | Status: DC
  Administered 2017-04-23: 0.125 mg via ORAL
  Filled 2017-04-23: qty 1

## 2017-04-23 MED ORDER — HYDROCHLOROTHIAZIDE 25 MG PO TABS: 25.0000 mg | ORAL_TABLET | Freq: Every day | ORAL | 0 refills | Status: DC

## 2017-04-23 MED ORDER — HYDROCODONE-ACETAMINOPHEN 10-325 MG PO TABS
1.0000 | ORAL_TABLET | Freq: Every day | ORAL | Status: DC
  Administered 2017-04-23: 1 via ORAL
  Filled 2017-04-23: qty 1

## 2017-04-23 MED ORDER — ONDANSETRON HCL 4 MG/2ML IJ SOLN
4.0000 mg | Freq: Four times a day (QID) | INTRAMUSCULAR | Status: DC | PRN
Start: 2017-04-23 — End: 2017-04-23

## 2017-04-23 MED ORDER — CARVEDILOL 6.25 MG PO TABS: 6.2500 mg | ORAL_TABLET | Freq: Two times a day (BID) | ORAL | 0 refills | Status: AC

## 2017-04-23 MED ORDER — CARVEDILOL 3.125 MG PO TABS: 6.2500 mg | ORAL_TABLET | Freq: Two times a day (BID) | ORAL | Status: DC

## 2017-04-23 MED ORDER — HYDROCHLOROTHIAZIDE 25 MG PO TABS
25.0000 mg | ORAL_TABLET | Freq: Every day | ORAL | Status: DC
  Administered 2017-04-23: 25 mg via ORAL
  Filled 2017-04-23: qty 1

## 2017-04-23 MED ORDER — ENOXAPARIN SODIUM 40 MG/0.4ML ~~LOC~~ SOLN
40.0000 mg | SUBCUTANEOUS | Status: DC
  Administered 2017-04-23: 40 mg via SUBCUTANEOUS
  Filled 2017-04-23: qty 0.4

## 2017-04-23 MED ORDER — SPIRONOLACTONE 25 MG PO TABS
25.0000 mg | ORAL_TABLET | Freq: Every day | ORAL | Status: DC
  Administered 2017-04-23: 09:00:00 25 mg via ORAL
  Filled 2017-04-23: qty 1

## 2017-04-23 MED ORDER — POTASSIUM CHLORIDE CRYS ER 20 MEQ PO TBCR
20.0000 meq | EXTENDED_RELEASE_TABLET | Freq: Every day | ORAL | Status: DC
  Administered 2017-04-23: 09:00:00 20 meq via ORAL
  Filled 2017-04-23: qty 1

## 2017-04-23 MED ORDER — HYDRALAZINE HCL 25 MG PO TABS: 25.0000 mg | ORAL_TABLET | Freq: Three times a day (TID) | ORAL | 0 refills | Status: AC

## 2017-04-23 MED ORDER — BENAZEPRIL HCL 20 MG PO TABS
40.0000 mg | ORAL_TABLET | Freq: Every day | ORAL | Status: DC
  Administered 2017-04-23: 40 mg via ORAL
  Filled 2017-04-23: qty 2

## 2017-04-23 MED ORDER — HYDROCODONE-ACETAMINOPHEN 10-325 MG PO TABS
1.0000 | ORAL_TABLET | Freq: Two times a day (BID) | ORAL | Status: DC | PRN
Start: 2017-04-23 — End: 2017-04-23

## 2017-04-23 MED ORDER — POTASSIUM CHLORIDE CRYS ER 10 MEQ PO TBCR: 10.0000 meq | EXTENDED_RELEASE_TABLET | Freq: Every day | ORAL | 0 refills | Status: DC

## 2017-04-23 MED ORDER — BENAZEPRIL HCL 40 MG PO TABS
40.0000 mg | ORAL_TABLET | Freq: Every day | ORAL | Status: DC
  Filled 2017-04-23: qty 1

## 2017-04-23 MED ORDER — ACETAMINOPHEN 325 MG PO TABS
650.0000 mg | ORAL_TABLET | Freq: Four times a day (QID) | ORAL | Status: DC | PRN
  Administered 2017-04-23 (×2): 650 mg via ORAL
  Filled 2017-04-23 (×2): qty 2

## 2017-04-23 MED ORDER — ENALAPRILAT 1.25 MG/ML IV SOLN
0.6250 mg | Freq: Once | INTRAVENOUS | Status: AC
  Administered 2017-04-23: 0.625 mg via INTRAVENOUS
  Filled 2017-04-23: qty 0.5

## 2017-04-23 MED ORDER — HYOSCYAMINE SULFATE 0.125 MG PO TABS
0.1250 mg | ORAL_TABLET | Freq: Every day | ORAL | Status: DC
  Filled 2017-04-23 (×2): qty 1

## 2017-04-23 MED ORDER — ONDANSETRON HCL 4 MG PO TABS: 4.0000 mg | ORAL_TABLET | Freq: Four times a day (QID) | ORAL | Status: DC | PRN

## 2017-04-23 MED ORDER — FLUOXETINE HCL 20 MG PO TABS: 20.0000 mg | ORAL_TABLET | Freq: Two times a day (BID) | ORAL | Status: DC

## 2017-04-23 MED ORDER — OXYCODONE HCL 5 MG PO TABS
5.0000 mg | ORAL_TABLET | ORAL | Status: DC | PRN
  Filled 2017-04-23: qty 1

## 2017-04-23 MED ORDER — ACETAMINOPHEN 650 MG RE SUPP
650.0000 mg | Freq: Four times a day (QID) | RECTAL | Status: DC | PRN
Start: 2017-04-23 — End: 2017-04-23

## 2017-04-23 NOTE — Plan of Care (Signed)
Hypertensive, bradycardic, IV Vasotec 0.625mg  given, HR 83 when administered.  Reported HA 10/10, refused PRN PO Oxy 5mg , received requested PRN PO Tylenol 650mg , reports HA improved, but not relieved.  No other complaints overnight.  Oriented to unit.  Bed in low position, call bell within reach.  WCTM.

## 2017-04-23 NOTE — ED Notes (Signed)
Report  Called to dorothy rn floor nurse

## 2017-04-23 NOTE — H&P (Signed)
[facility] [place] at [facility]   PATIENT NAME: Peggy Mclaughlin    MR#:  [id number]  DATE OF BIRTH:  Nov 05, 1940  DATE OF ADMISSION:  04/22/2017  PRIMARY CARE PHYSICIAN: Orene Desanctis, MD   REQUESTING/REFERRING PHYSICIAN: Scotty Court, MD  CHIEF COMPLAINT:   Chief Complaint  Patient presents with  . Headache    HISTORY OF PRESENT ILLNESS:  Peggy Mclaughlin  is a 76 y.o. female who presents with several weeks of frequent headaches.  She states that she has been checking her blood pressure over this time and that it has been consistently elevated.  She had some medication adjustment by her PCP, but her blood pressure has remained high.  She came in tonight and her blood pressure was again in the 200 systolic.  She got several doses of antihypertensives here and her blood pressure has been difficult to lower.  Other workup is largely within normal limits, hospitalists were called for admission  PAST MEDICAL HISTORY:   Past Medical History:  Diagnosis Date  . Bladder spasms   . Irregular heart beat   . Lower back pain   . Multiple sclerosis (HCC)     PAST SURGICAL HISTORY:   Past Surgical History:  Procedure Laterality Date  . CARPAL TUNNEL RELEASE    . SPINE SURGERY     lumbar and thoracic  . TONSILLECTOMY    . TUBAL LIGATION      SOCIAL HISTORY:   Social History   Tobacco Use  . Smoking status: Never Smoker  . Smokeless tobacco: Never Used  Substance Use Topics  . Alcohol use: No    FAMILY HISTORY:   Family History  Problem Relation Age of Onset  . Depression Mother   . Lung cancer Father     DRUG ALLERGIES:   Allergies  Allergen Reactions  . Amlodipine Swelling  . Ampicillin Other (See Comments)    Oral soreness and numbness  . Sulfa Antibiotics Nausea Only  . Aspirin Nausea Only  . Demerol [Meperidine] Nausea And Vomiting  . Adhesive [Tape] Rash  . Baclofen Other (See Comments)    Difficulty moving  .  Simvastatin Other (See Comments)    Body aches  . Tapentadol Rash    MEDICATIONS AT HOME:   Prior to Admission medications   Medication Sig Start Date End Date Taking? Authorizing Provider  benazepril (LOTENSIN) 40 MG tablet Take 40 mg by mouth daily.   Yes [provider]  carvedilol (COREG) 3.125 MG tablet Take 1 tablet 2 (two) times daily by mouth. 04/03/17 04/03/18 Yes [provider]  estradiol (ESTRACE) 0.1 MG/GM vaginal cream Place 1 Applicatorful vaginally at bedtime.   Yes [provider]  FLUoxetine (PROZAC) 20 MG tablet Take 20 mg 2 (two) times daily by mouth.    Yes [provider]  fluticasone (FLONASE) 50 MCG/ACT nasal spray Place 2 sprays into both nostrils daily.   Yes [provider]  HYDROcodone-acetaminophen (NORCO) 10-325 MG tablet Take 1 tablet by mouth daily after breakfast.   Yes [provider]  hyoscyamine (LEVSIN, ANASPAZ) 0.125 MG tablet Take 0.125 mg by mouth daily.   Yes [provider]  OXcarbazepine (TRILEPTAL) 150 MG tablet Take 1 tablet 2 (two) times daily by mouth. 02/21/17 02/21/18 Yes [provider]  spironolactone (ALDACTONE) 25 MG tablet Take 25 mg by mouth daily.   Yes [provider]  acetaminophen (TYLENOL) 500 MG tablet Take 1 tablet every 8 (eight) hours as  needed by mouth.    [provider]  diclofenac sodium (VOLTAREN) 1 % GEL Apply 4 g 3 (three) times daily topically.    [provider]    REVIEW OF SYSTEMS:  Review of Systems  Constitutional: Negative for chills, fever, malaise/fatigue and weight loss.  HENT: Negative for ear pain, hearing loss and tinnitus.   Eyes: Negative for blurred vision, double vision, pain and redness.  Respiratory: Negative for cough, hemoptysis and shortness of breath.   Cardiovascular: Negative for chest pain, palpitations, orthopnea and leg swelling.  Gastrointestinal: Negative for abdominal pain, constipation,  diarrhea, nausea and vomiting.  Genitourinary: Negative for dysuria, frequency and hematuria.  Musculoskeletal: Negative for back pain, joint pain and neck pain.  Skin:       No acne, rash, or lesions  Neurological: Positive for headaches. Negative for dizziness, tremors, focal weakness and weakness.  Endo/Heme/Allergies: Negative for polydipsia. Does not bruise/bleed easily.  Psychiatric/Behavioral: Negative for depression. The patient is not nervous/anxious and does not have insomnia.      VITAL SIGNS:   Vitals:   04/22/17 2230 04/22/17 2300 04/22/17 2330 04/23/17 0000  BP: (!) 158/76 (!) 160/107 (!) 159/86 (!) 176/77  Pulse:   76 87  Resp: (!) 21 20 20 16   Temp:      TempSrc:      SpO2:   (!) 89% 95%  Weight:      Height:       Wt Readings from Last 3 Encounters:  04/22/17 88.9 kg (196 lb)  08/11/16 86.2 kg (190 lb)  07/05/16 88.5 kg (195 lb)    PHYSICAL EXAMINATION:  Physical Exam  Vitals reviewed. Constitutional: She is oriented to person, place, and time. She appears well-developed and well-nourished. No distress.  HENT:  Head: Normocephalic and atraumatic.  Mouth/Throat: Oropharynx is clear and moist.  Eyes: Conjunctivae and EOM are normal. Pupils are equal, round, and reactive to light. No scleral icterus.  Neck: Normal range of motion. Neck supple. No JVD present. No thyromegaly present.  Cardiovascular: Normal rate, regular rhythm and intact distal pulses. Exam reveals no gallop and no friction rub.  No murmur heard. Respiratory: Effort normal and breath sounds normal. No respiratory distress. She has no wheezes. She has no rales.  GI: Soft. Bowel sounds are normal. She exhibits no distension. There is no tenderness.  Musculoskeletal: Normal range of motion. She exhibits no edema.  No arthritis, no gout  Lymphadenopathy:    She has no cervical adenopathy.  Neurological: She is alert and oriented to person, place, and time. No cranial nerve deficit.  No  dysarthria, no aphasia  Skin: Skin is warm and dry. No rash noted. No erythema.  Psychiatric: She has a normal mood and affect. Her behavior is normal. Judgment and thought content normal.    LABORATORY PANEL:   CBC Recent Labs  Lab 04/22/17 1649  WBC 6.1  HGB 13.3  HCT 39.3  PLT 203   ------------------------------------------------------------------------------------------------------------------  Chemistries  Recent Labs  Lab 04/22/17 1649  NA 135  K 3.6  CL 99*  CO2 26  GLUCOSE 99  BUN 21*  CREATININE 1.20*  CALCIUM 9.2   ------------------------------------------------------------------------------------------------------------------  Cardiac Enzymes No results for input(s): TROPONINI in the last 168 hours. ------------------------------------------------------------------------------------------------------------------  RADIOLOGY:  Ct Head Wo Contrast  Result Date: 04/22/2017 CLINICAL DATA:  Hypertension over the past 10 days with headache over the past 3 weeks. EXAM: CT HEAD WITHOUT CONTRAST TECHNIQUE: Contiguous axial images were obtained from the base  of the skull through the vertex without intravenous contrast. COMPARISON:  None. FINDINGS: Brain: Moderate degree of periventricular, subcortical and deep white matter hypodensity consistent with chronic microvascular ischemia. No large vascular territory infarction, hemorrhage or midline shift. No hydrocephalus. 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 No effacement of the fourth ventricle nor basal cisterns. No intra-axial mass. Vascular: No hyperdense vessels. Moderate atherosclerosis of the carotid siphons. Skull: No acute fracture or suspicious osseous lesions. Sinuses/Orbits: No acute finding. Other: None. IMPRESSION: Moderate degree of chronic appearing small vessel ischemia involving the periventricular and subcortical white matter. No acute intracranial abnormality. Electronically Signed   By:  Tollie Ethavid  Kwon M.D.   On: 04/22/2017 18:51    EKG:  No orders found for this or any previous visit.  IMPRESSION AND PLAN:  Principal Problem:   Uncontrolled hypertension -continue home dose antihypertensives, increase Coreg dose, as needed antihypertensives for now for blood pressure control less than 160/100, this can be further reduced in the morning. Active Problems:   Headache -suspect this is largely due to her persistently significantly elevated blood pressure.  Treatment as above, monitor for improvement.  CT head showed some chronic changes related to her MS, but is stable from prior study   Depression -home dose antidepressants   CKD (chronic kidney disease), stage III (HCC) -stable, avoid nephrotoxins and monitor  All the records are reviewed and case discussed with ED provider. Management plans discussed with the patient and/or family.  DVT PROPHYLAXIS: SubQ lovenox  GI PROPHYLAXIS: None  ADMISSION STATUS: Observation  CODE STATUS: Full Code Status History    This patient does not have a recorded code status. Please follow your organizational policy for patients in this situation.      TOTAL TIME TAKING CARE OF THIS PATIENT: 40 minutes.   Peggy Mclaughlin FIELDING 04/23/2017, 12:11 AM  [facility]  Office  [phone]  CC: Primary care physician; Orene DesanctisBehling, Karen, MD  Note:  This document was prepared using Dragon voice recognition software and may include unintentional dictation errors.

## 2017-04-23 NOTE — ED Notes (Signed)
Lupita LeashDonna Hux [phone]   Daughter.

## 2017-04-23 NOTE — Progress Notes (Signed)
[facility] pharmacy confirmed patient has prescription for norco bid prn.  She did not want any additional pain medication other than tylenol prn

## 2017-04-23 NOTE — Care Management Obs Status (Signed)
MEDICARE OBSERVATION STATUS NOTIFICATION   Patient Details  Name: Peggy Mclaughlin MRN: [id number] Date of Birth: Oct 17, 1940   Medicare Observation Status Notification Given:  Yes    Gwenette Greet, RN 04/23/2017, 11:02 AM

## 2017-04-24 LAB — URINE CULTURE

## 2017-04-24 NOTE — Discharge Summary (Signed)
[facility] [place] [facility]   PATIENT NAME: Peggy Mclaughlin    MR#:  [id number]  DATE OF BIRTH:  November 06, 1940  DATE OF ADMISSION:  04/22/2017 ADMITTING PHYSICIAN: Oralia Manis, MD  DATE OF DISCHARGE: 04/23/2017  7:36 PM  PRIMARY CARE PHYSICIAN: Orene Desanctis, MD   ADMISSION DIAGNOSIS:  Bad headache [R51] Uncontrolled hypertension [I10]  DISCHARGE DIAGNOSIS:  Principal Problem:   Uncontrolled hypertension Active Problems:   Headache   Depression   CKD (chronic kidney disease), stage III (HCC)   SECONDARY DIAGNOSIS:   Past Medical History:  Diagnosis Date  . Bladder spasms   . Irregular heart beat   . Lower back pain   . Multiple sclerosis (HCC)      ADMITTING HISTORY  HISTORY OF PRESENT ILLNESS:  Peggy Mclaughlin  is a 76 y.o. female who presents with several weeks of frequent headaches.  She states that she has been checking her blood pressure over this time and that it has been consistently elevated.  She had some medication adjustment by her PCP, but her blood pressure has remained high.  She came in tonight and her blood pressure was again in the 200 systolic.  She got several doses of antihypertensives here and her blood pressure has been difficult to lower.  Other workup is largely within normal limits, hospitalists were called for admission     HOSPITAL COURSE:   *Accelerated hypertension.  Patient was initially given IV doses of enalapril and labetalol.  With this her blood pressure improved a little.  Her home medications of Coreg and benazepril were continued.  Hydrochlorothiazide and hydralazine added.  By the time of discharge blood pressure is 159/85 with heart rate of 90.  Prescriptions given for new medications.  Coreg dose increased.  She will follow-up with her primary care physician within 1 week.  Advised her to check blood pressure daily and keep a log.  She did have headache due to accelerated hypertension which is  improving.  Patient is stable for discharge home.  CONSULTS OBTAINED:    DRUG ALLERGIES:   Allergies  Allergen Reactions  . Amlodipine Swelling  . Ampicillin Other (See Comments)    Oral soreness and numbness  . Sulfa Antibiotics Nausea Only  . Aspirin Nausea Only  . Demerol [Meperidine] Nausea And Vomiting  . Adhesive [Tape] Rash  . Baclofen Other (See Comments)    Difficulty moving  . Simvastatin Other (See Comments)    Body aches  . Tapentadol Rash    DISCHARGE MEDICATIONS:   Discharge Medication List as of 04/23/2017  6:10 PM    START taking these medications   Details  hydrALAZINE (APRESOLINE) 25 MG tablet Take 1 tablet (25 mg total) by mouth 3 (three) times daily., Starting Tue 04/23/2017, Normal    hydrochlorothiazide (HYDRODIURIL) 25 MG tablet Take 1 tablet (25 mg total) by mouth daily., Starting Wed 04/24/2017, Normal    potassium chloride SA (K-DUR,KLOR-CON) 10 MEQ tablet Take 1 tablet (10 mEq total) by mouth daily., Starting Wed 04/24/2017, Normal      CONTINUE these medications which have CHANGED   Details  carvedilol (COREG) 6.25 MG tablet Take 1 tablet (6.25 mg total) by mouth 2 (two) times daily., Starting Tue 04/23/2017, Until Wed 04/23/2018, Normal      CONTINUE these medications which have NOT CHANGED   Details  benazepril (LOTENSIN) 40 MG tablet Take 40 mg by mouth daily., Historical Med    estradiol (ESTRACE) 0.1 MG/GM vaginal cream  Place 1 Applicatorful vaginally at bedtime., Historical Med    FLUoxetine (PROZAC) 20 MG tablet Take 20 mg 2 (two) times daily by mouth. , Historical Med    fluticasone (FLONASE) 50 MCG/ACT nasal spray Place 2 sprays into both nostrils daily., Historical Med    HYDROcodone-acetaminophen (NORCO) 10-325 MG tablet Take 1 tablet 2 (two) times daily as needed by mouth (Pain). , Historical Med    hyoscyamine (LEVSIN, ANASPAZ) 0.125 MG tablet Take 0.125 mg by mouth daily., Historical Med    OXcarbazepine (TRILEPTAL)  150 MG tablet Take 1 tablet 2 (two) times daily by mouth., Starting Thu 02/21/2017, Until Fri 02/21/2018, Historical Med    spironolactone (ALDACTONE) 25 MG tablet Take 25 mg by mouth daily., Historical Med    acetaminophen (TYLENOL) 500 MG tablet Take 1 tablet every 8 (eight) hours as needed by mouth., Historical Med    diclofenac sodium (VOLTAREN) 1 % GEL Apply 4 g 3 (three) times daily topically., Historical Med        Today   VITAL SIGNS:  Blood pressure (!) 159/80, pulse 95, temperature 98.2 F (36.8 C), temperature source Oral, resp. rate 20, height 5\' 4"  (1.626 m), weight 87.2 kg (192 lb 4.8 oz), SpO2 96 %.  I/O:  No intake or output data in the 24 hours ending 04/24/17 1303  PHYSICAL EXAMINATION:  Physical Exam  GENERAL:  76 y.o.-year-old patient lying in the bed with no acute distress.  LUNGS: Normal breath sounds bilaterally, no wheezing, rales,rhonchi or crepitation. No use of accessory muscles of respiration.  CARDIOVASCULAR: S1, S2 normal. No murmurs, rubs, or gallops.  ABDOMEN: Soft, non-tender, non-distended. Bowel sounds present. No organomegaly or mass.  PSYCHIATRIC: The patient is alert and oriented x 3.  SKIN: No obvious rash, lesion, or ulcer.   DATA REVIEW:   CBC Recent Labs  Lab 04/23/17 0317  WBC 7.5  HGB 13.5  HCT 39.7  PLT 207    Chemistries  Recent Labs  Lab 04/23/17 0317  NA 135  K 3.7  CL 102  CO2 25  GLUCOSE 112*  BUN 18  CREATININE 1.03*  CALCIUM 9.3    Cardiac Enzymes No results for input(s): TROPONINI in the last 168 hours.  Microbiology Results  Results for orders placed or performed during the hospital encounter of 04/22/17  Urine culture     Status: Abnormal   Collection Time: 04/22/17  6:07 PM  Result Value Ref Range Status   Specimen Description URINE, RANDOM  Final   Special Requests NONE  Final   Culture MULTIPLE SPECIES PRESENT, SUGGEST RECOLLECTION (A)  Final   Report Status 04/24/2017 FINAL  Final     RADIOLOGY:  Ct Head Wo Contrast  Result Date: 04/22/2017 CLINICAL DATA:  Hypertension over the past 10 days with headache over the past 3 weeks. EXAM: CT HEAD WITHOUT CONTRAST TECHNIQUE: Contiguous axial images were obtained from the base of the skull through the vertex without intravenous contrast. COMPARISON:  None. FINDINGS: Brain: Moderate degree of periventricular, subcortical and deep white matter hypodensity consistent with chronic microvascular ischemia. No large vascular territory infarction, hemorrhage or midline shift. No hydrocephalus. 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 No effacement of the fourth ventricle nor basal cisterns. No intra-axial mass. Vascular: No hyperdense vessels. Moderate atherosclerosis of the carotid siphons. Skull: No acute fracture or suspicious osseous lesions. Sinuses/Orbits: No acute finding. Other: None. IMPRESSION: Moderate degree of chronic appearing small vessel ischemia involving the periventricular and subcortical white matter. No acute intracranial  abnormality. Electronically Signed   By: Tollie Eth M.D.   On: 04/22/2017 18:51    Follow up with PCP in 1 week.  Management plans discussed with the patient, family and they are in agreement.  CODE STATUS:  Code Status History    Date Active Date Inactive Code Status Order ID Comments User Context   04/23/2017 02:16 04/23/2017 22:43 Full Code [id number]  Oralia Manis, MD Inpatient      TOTAL TIME TAKING CARE OF THIS PATIENT ON DAY OF DISCHARGE: more than 30 minutes.   Molinda Bailiff Peggy Mclaughlin M.D on 04/24/2017 at 1:03 PM  Between 7am to 6pm - Pager - [phone]  After 6pm go to [email or web address] - password [email or web address]  [facility] [place] [facility]  Office  [phone]  CC: Primary care physician; Orene Desanctis, MD  Note: This dictation was prepared with Dragon dictation along with smaller phrase technology. Any transcriptional errors that result from this process are  unintentional.

## 2017-06-28 ENCOUNTER — Other Ambulatory Visit: Payer: Self-pay

## 2017-06-28 ENCOUNTER — Emergency Department
Admission: EM | Admit: 2017-06-28 | Discharge: 2017-06-28 | Disposition: A | Discharge status: Home / Self Care | Payer: Medicare Other | Attending: Emergency Medicine | Admitting: Emergency Medicine

## 2017-06-28 ENCOUNTER — Encounter: Payer: Self-pay | Admitting: Emergency Medicine

## 2017-06-28 DIAGNOSIS — R110 Nausea: Secondary | ICD-10-CM | POA: Insufficient documentation

## 2017-06-28 DIAGNOSIS — Z79899 Other long term (current) drug therapy: Secondary | ICD-10-CM | POA: Diagnosis not present

## 2017-06-28 DIAGNOSIS — I4891 Unspecified atrial fibrillation: Secondary | ICD-10-CM | POA: Insufficient documentation

## 2017-06-28 DIAGNOSIS — N183 Chronic kidney disease, stage 3 (moderate): Secondary | ICD-10-CM | POA: Insufficient documentation

## 2017-06-28 DIAGNOSIS — I129 Hypertensive chronic kidney disease with stage 1 through stage 4 chronic kidney disease, or unspecified chronic kidney disease: Secondary | ICD-10-CM | POA: Diagnosis not present

## 2017-06-28 DIAGNOSIS — Z882 Allergy status to sulfonamides status: Secondary | ICD-10-CM | POA: Diagnosis not present

## 2017-06-28 DIAGNOSIS — N390 Urinary tract infection, site not specified: Secondary | ICD-10-CM | POA: Diagnosis not present

## 2017-06-28 DIAGNOSIS — I10 Essential (primary) hypertension: Secondary | ICD-10-CM

## 2017-06-28 DIAGNOSIS — Z8679 Personal history of other diseases of the circulatory system: Secondary | ICD-10-CM | POA: Diagnosis not present

## 2017-06-28 HISTORY — DX: Essential (primary) hypertension: I10

## 2017-06-28 HISTORY — DX: Esophageal obstruction: K22.2

## 2017-06-28 HISTORY — DX: Radiculopathy, lumbar region: M54.16

## 2017-06-28 HISTORY — DX: Abnormal electrocardiogram (ECG) (EKG): R94.31

## 2017-06-28 HISTORY — DX: Unspecified abnormalities of gait and mobility: R26.9

## 2017-06-28 HISTORY — DX: Postmenopausal atrophic vaginitis: N95.2

## 2017-06-28 HISTORY — DX: Constipation, unspecified: K59.00

## 2017-06-28 HISTORY — DX: Unspecified urinary incontinence: R32

## 2017-06-28 HISTORY — DX: Chronic kidney disease, stage 3 (moderate): N18.3

## 2017-06-28 HISTORY — DX: Bilateral inguinal hernia, without obstruction or gangrene, not specified as recurrent: K40.20

## 2017-06-28 HISTORY — DX: Other long term (current) drug therapy: Z79.899

## 2017-06-28 HISTORY — DX: Personal history of other infectious and parasitic diseases: Z86.19

## 2017-06-28 HISTORY — DX: Palpitations: R00.2

## 2017-06-28 HISTORY — DX: Other specified disorders of bone density and structure, multiple sites: M85.89

## 2017-06-28 HISTORY — DX: Spondylosis without myelopathy or radiculopathy, lumbar region: M47.816

## 2017-06-28 HISTORY — DX: Chronic kidney disease, stage 3 unspecified: N18.30

## 2017-06-28 HISTORY — DX: Central pain syndrome: G89.0

## 2017-06-28 LAB — URINALYSIS, ROUTINE W REFLEX MICROSCOPIC
Bilirubin Urine: NEGATIVE
Glucose, UA: NEGATIVE mg/dL
HGB URINE DIPSTICK: NEGATIVE
KETONES UR: 5 mg/dL — AB
NITRITE: POSITIVE — AB
PROTEIN: NEGATIVE mg/dL
Specific Gravity, Urine: 1.014 (ref 1.005–1.030)
Squamous Epithelial / LPF: NONE SEEN
pH: 7 (ref 5.0–8.0)

## 2017-06-28 LAB — CBC
HCT: 41.5 % (ref 35.0–47.0)
HEMOGLOBIN: 13.9 g/dL (ref 12.0–16.0)
MCH: 31.7 pg (ref 26.0–34.0)
MCHC: 33.5 g/dL (ref 32.0–36.0)
MCV: 94.6 fL (ref 80.0–100.0)
Platelets: 201 10*3/uL (ref 150–440)
RBC: 4.38 MIL/uL (ref 3.80–5.20)
RDW: 14.1 % (ref 11.5–14.5)
WBC: 7.8 10*3/uL (ref 3.6–11.0)

## 2017-06-28 LAB — COMPREHENSIVE METABOLIC PANEL
ALBUMIN: 4.3 g/dL (ref 3.5–5.0)
ALK PHOS: 83 U/L (ref 38–126)
ALT: 22 U/L (ref 14–54)
ANION GAP: 13 (ref 5–15)
AST: 35 U/L (ref 15–41)
BUN: 11 mg/dL (ref 6–20)
CALCIUM: 9.8 mg/dL (ref 8.9–10.3)
CHLORIDE: 97 mmol/L — AB (ref 101–111)
CO2: 20 mmol/L — AB (ref 22–32)
Creatinine, Ser: 0.89 mg/dL (ref 0.44–1.00)
GFR calc Af Amer: >60 mL/min (ref >60)
GFR calc non Af Amer: >60 mL/min (ref >60)
Glucose, Bld: 116 mg/dL — ABNORMAL HIGH (ref 65–99)
Potassium: 3.7 mmol/L (ref 3.5–5.1)
SODIUM: 130 mmol/L — AB (ref 135–145)
Total Bilirubin: 1.5 mg/dL — ABNORMAL HIGH (ref 0.3–1.2)
Total Protein: 7.5 g/dL (ref 6.5–8.1)

## 2017-06-28 LAB — TROPONIN I: Troponin I: <0.03 ng/mL (ref <0.03)

## 2017-06-28 MED ORDER — FOSFOMYCIN TROMETHAMINE 3 G PO PACK
3.0000 g | PACK | Freq: Once | ORAL | Status: AC
  Administered 2017-06-28: 3 g via ORAL
  Filled 2017-06-28: qty 3

## 2017-06-28 MED ORDER — KETOROLAC TROMETHAMINE 30 MG/ML IJ SOLN
15.0000 mg | Freq: Once | INTRAMUSCULAR | Status: AC
Start: 2017-06-28 — End: 2017-06-28
  Administered 2017-06-28: 15 mg via INTRAVENOUS
  Filled 2017-06-28: qty 1

## 2017-06-28 MED ORDER — SODIUM CHLORIDE 0.9 % IV BOLUS (SEPSIS)
500.0000 mL | Freq: Once | INTRAVENOUS | Status: AC
  Administered 2017-06-28: 500 mL via INTRAVENOUS

## 2017-06-28 MED ORDER — LABETALOL HCL 100 MG PO TABS
100.0000 mg | ORAL_TABLET | Freq: Once | ORAL | Status: AC
  Administered 2017-06-28: 100 mg via ORAL
  Filled 2017-06-28: qty 1

## 2017-06-28 MED ORDER — LABETALOL HCL 100 MG PO TABS: 100.0000 mg | ORAL_TABLET | Freq: Two times a day (BID) | ORAL | 0 refills | Status: AC

## 2017-06-28 MED ORDER — ONDANSETRON HCL 4 MG/2ML IJ SOLN
4.0000 mg | Freq: Once | INTRAMUSCULAR | Status: AC
  Administered 2017-06-28: 4 mg via INTRAVENOUS
  Filled 2017-06-28: qty 2

## 2017-06-28 NOTE — Discharge Instructions (Addendum)
Please follow-up with your doctor in the next several days for recheck/reevaluation.  Your workup today has shown largely normal results besides a mild urinary tract infection which has been treated with antibiotics and a urine culture has been sent.  Return to the emergency department for any chest pain, trouble breathing, or any other symptom personally concerning to yourself.  As we discussed please discontinue use of hydralazine.  Please start labetalol as prescribed today in the emergency department.  Please call the number for cardiology to arrange follow-up appointment on Monday or Tuesday.

## 2017-06-28 NOTE — ED Triage Notes (Signed)
Pt presents to ED via [facility] with c/o hypertension. Per EMS pt checked her BP at home and took her PRN hydralazine 25mg  (pt is supposed to take for BP > 150 systolic). Pt states took 3 doses of PRN medication today and was still getting high readings with her blood pressure. EMS also reports pt in A-fib, pt denies hx of irregular heart beat.

## 2017-06-28 NOTE — ED Provider Notes (Addendum)
[facility] Emergency Department Provider Note  Time seen: 7:08 PM  I have reviewed the triage vital signs and the nursing notes.   HISTORY  Chief Complaint Hypertension    HPI Peggy Mclaughlin is a 77 y.o. female with a past medical history of hypertension, chronic back pain on hydrocodone, CKD, presents to the emergency department for hypertension.  According to the patient today she has had a headache and felt somewhat nauseated.  She took her blood pressure this morning and it was 180 systolic.  States she has had issues with her blood pressure in the past, several months ago she was admitted to the hospital for blood pressure greater than 200.  Her doctor put her on blood pressure medication as well as as needed hydralazine up to 3 times per day if needed for blood pressure over 150.  Patient states he took 25 mg of hydralazine this morning after her initial elevated blood pressure reading.  Recheck at around lunchtime today it remained elevated and she took a second tablet of hydralazine.  This evening around 4 PM she took it once again and it was 190 systolic so she took a 3rd tablet of hydralazine and came to the emergency department for evaluation.  Patient denies any chest pain or trouble breathing.  Denies abdominal pain or vomiting but does state nausea.  States she is having a little bit of urinary pressure and recently was treated for urinary tract infection antibiotic completed 3 days ago.   Past Medical History:  Diagnosis Date  . Atrophic vaginitis   . Bilateral inguinal hernia without obstruction or gangrene   . Bladder spasms   . Central pain syndrome   . Constipation   . Gait disorder   . Heart palpitations   . High risk medication use   . History of Helicobacter infection   . Hypertension   . Incontinence of urine   . Irregular heart beat   . Lower back pain   . Lumbar facet arthropathy   . Lumbar radicular pain   . Multiple sclerosis  (HCC)   . Osteopenia of multiple sites   . Prolonged QT interval   . Schatzki's ring   . Stage 3 chronic kidney disease [facility])     Patient Active Problem List   Diagnosis Date Noted  . Uncontrolled hypertension 04/23/2017  . Headache 04/23/2017  . Depression 04/23/2017  . CKD (chronic kidney disease), stage III (HCC) 04/23/2017    Past Surgical History:  Procedure Laterality Date  . CARPAL TUNNEL RELEASE    . SPINE SURGERY     lumbar and thoracic  . TONSILLECTOMY    . TUBAL LIGATION      Prior to Admission medications   Medication Sig Start Date End Date Taking? Authorizing Provider  acetaminophen (TYLENOL) 500 MG tablet Take 1 tablet every 8 (eight) hours as needed by mouth.    [provider]  benazepril (LOTENSIN) 40 MG tablet Take 40 mg by mouth daily.    [provider]  carvedilol (COREG) 6.25 MG tablet Take 1 tablet (6.25 mg total) by mouth 2 (two) times daily. 04/23/17 04/23/18  Milagros Loll, MD  diclofenac sodium (VOLTAREN) 1 % GEL Apply 4 g 3 (three) times daily topically.    [provider]  estradiol (ESTRACE) 0.1 MG/GM vaginal cream Place 1 Applicatorful vaginally at bedtime.    [provider]  FLUoxetine (PROZAC) 20 MG tablet Take 20 mg 2 (two) times daily by mouth.  [provider]  fluticasone (FLONASE) 50 MCG/ACT nasal spray Place 2 sprays into both nostrils daily.    [provider]  hydrALAZINE (APRESOLINE) 25 MG tablet Take 1 tablet (25 mg total) by mouth 3 (three) times daily. 04/23/17   Milagros Loll, MD  hydrochlorothiazide (HYDRODIURIL) 25 MG tablet Take 1 tablet (25 mg total) by mouth daily. 04/24/17   Milagros Loll, MD  HYDROcodone-acetaminophen (NORCO) 10-325 MG tablet Take 1 tablet 2 (two) times daily as needed by mouth (Pain).     [provider]  hyoscyamine (LEVSIN, ANASPAZ) 0.125 MG tablet Take 0.125 mg by mouth daily.    [provider]  OXcarbazepine (TRILEPTAL) 150 MG  tablet Take 1 tablet 2 (two) times daily by mouth. 02/21/17 02/21/18  [provider]  potassium chloride SA (K-DUR,KLOR-CON) 10 MEQ tablet Take 1 tablet (10 mEq total) by mouth daily. 04/24/17   Milagros Loll, MD  spironolactone (ALDACTONE) 25 MG tablet Take 25 mg by mouth daily.    [provider]    Allergies  Allergen Reactions  . Amlodipine Swelling  . Ampicillin Other (See Comments)    Oral soreness and numbness  . Sulfa Antibiotics Nausea Only  . Aspirin Nausea Only  . Demerol [Meperidine] Nausea And Vomiting  . Adhesive [Tape] Rash  . Baclofen Other (See Comments)    Difficulty moving  . Simvastatin Other (See Comments)    Body aches  . Tapentadol Rash    Family History  Problem Relation Age of Onset  . Depression Mother   . Lung cancer Father     Social History Social History   Tobacco Use  . Smoking status: Never Smoker  . Smokeless tobacco: Never Used  Substance Use Topics  . Alcohol use: No  . Drug use: No    Review of Systems Constitutional: Negative for fever. Eyes: Negative for visual complaints ENT: Negative for recent illness/congestion Cardiovascular: Negative for chest pain. Respiratory: Negative for shortness of breath. Gastrointestinal: Negative for abdominal pain.  Negative for vomiting.  Mild nausea today. Genitourinary: States mild pressure with urination. Musculoskeletal: Negative for musculoskeletal complaints Skin: Negative for skin complaints  Neurological: Moderate headache. All other ROS negative  ____________________________________________   PHYSICAL EXAM:  VITAL SIGNS: ED Triage Vitals  Enc Vitals Group     BP 06/28/17 1835 (!) 180/86     Pulse Rate 06/28/17 1835 (!) 113     Resp 06/28/17 1835 17     Temp 06/28/17 1835 97.8 F (36.6 C)     Temp Source 06/28/17 1835 Oral     SpO2 06/28/17 1835 97 %     Weight 06/28/17 1836 193 lb (87.5 kg)     Height 06/28/17 1836 5\' 4"  (1.626 m)     Head  Circumference --      Peak Flow --      Pain Score 06/28/17 1836 8     Pain Loc --      Pain Edu? --      Excl. in GC? --     Constitutional: Alert and oriented. Well appearing and in no distress. Eyes: Normal exam ENT   Head: Normocephalic and atraumatic   Mouth/Throat: Mucous membranes are moist. Cardiovascular: Normal rate, regular rhythm around 100 bpm. Respiratory: Normal respiratory effort without tachypnea nor retractions. Breath sounds are clear Gastrointestinal: Soft and nontender. No distention.  Musculoskeletal: Nontender with normal range of motion in all extremities. No lower extremity tenderness Neurologic:  Normal speech and language. No gross focal  neurologic deficits Skin:  Skin is warm, dry and intact.  Psychiatric: Mood and affect are normal.   ____________________________________________    EKG  EKG reviewed and interpreted by myself shows normal sinus rhythm at 89 bpm with a narrow QRS, normal axis, normal intervals, no concerning ST changes.  ____________________________________________   INITIAL IMPRESSION / ASSESSMENT AND PLAN / ED COURSE  Pertinent labs & imaging results that were available during my care of the patient were reviewed by me and considered in my medical decision making (see chart for details).  Patient presented to the emergency department for hypertension.  Differential would include hypertension, nausea, headache.  Patient is normally taking hydrocodone for pain, states she is out of the medication however she refilled it today and began taking it this evening.  Could be mild withdrawal related as well if she was out of her normal daily medication.  Blood pressure currently 180/86.  We will check labs including cardiac enzymes, we will closely monitor in the emergency department.  We will dose Zofran for nausea and a small 500 bolus of IV fluids for slight tachycardia around 100 bpm currently.  Urinalysis shows a mild urinary tract  infection nitrite positive.  We will dose of fosfomycin one time in the emergency department and send a urine culture.  Patient's lab work otherwise is largely at baseline.  Mild hyponatremia but largely unchanged from several months ago 130 down from 135.  Patient's monitor is reading atrial fibrillation.  EKG appears to have P waves especially in lead V1.  Repeat EKG reviewed and interpreted by myself 22: 34: 43 appears to show atrial fibrillation no discernible clear P waves.  Narrow QRS, normal axis, largely normal intervals otherwise with nonspecific ST changes.  Given the possible atrial fibrillation I discussed the patient with [facility] of cardiology, he states given the patient's significant hypertension he would recommend starting on labetalol 100 mg twice daily.  States he would have the patient take either 81 mg aspirin or Plavix and have her follow-up in the office with him on Monday.  I discussed this with the patient she is agreeable to this plan of care.  Patient will be discharged.  Cardiology also recommends discontinuing hydralazine. ____________________________________________   FINAL CLINICAL IMPRESSION(S) / ED DIAGNOSES  Nausea Hypertension Headache    Minna Antis, MD 06/28/17 2229    Minna Antis, MD 06/28/17 2246

## 2017-06-28 NOTE — ED Notes (Signed)
No change in patient condition. Pt states that her nausea is better after Zofran, pt is noted to no long be vomiting. Pt resting in bed. Will continue to monitor for further patient needs at this time.

## 2017-07-01 LAB — URINE CULTURE: Culture: >=100000 — AB

## 2018-05-09 ENCOUNTER — Other Ambulatory Visit: Payer: Self-pay

## 2018-05-09 ENCOUNTER — Ambulatory Visit
Admission: EM | Admit: 2018-05-09 | Discharge: 2018-05-09 | Disposition: A | Discharge status: Home / Self Care | Payer: Medicare Other | Attending: Family Medicine | Admitting: Family Medicine

## 2018-05-09 ENCOUNTER — Encounter: Payer: Self-pay | Admitting: Emergency Medicine

## 2018-05-09 DIAGNOSIS — R109 Unspecified abdominal pain: Secondary | ICD-10-CM

## 2018-05-09 DIAGNOSIS — R05 Cough: Secondary | ICD-10-CM | POA: Diagnosis not present

## 2018-05-09 DIAGNOSIS — J0140 Acute pansinusitis, unspecified: Secondary | ICD-10-CM | POA: Diagnosis not present

## 2018-05-09 DIAGNOSIS — R059 Cough, unspecified: Secondary | ICD-10-CM

## 2018-05-09 DIAGNOSIS — R1030 Lower abdominal pain, unspecified: Secondary | ICD-10-CM

## 2018-05-09 MED ORDER — DOXYCYCLINE HYCLATE 100 MG PO CAPS: 100.0000 mg | ORAL_CAPSULE | Freq: Two times a day (BID) | ORAL | 0 refills | Status: AC

## 2018-05-09 MED ORDER — BENZONATATE 100 MG PO CAPS: 100.0000 mg | ORAL_CAPSULE | Freq: Three times a day (TID) | ORAL | 0 refills | Status: AC | PRN

## 2018-05-09 NOTE — Discharge Instructions (Signed)
Take medication as prescribed. Rest. Drink plenty of fluids.   Follow up with your primary care physician this week as prescribed. Return to Urgent care as needed. Proceed to Er for worsening complaints.

## 2018-05-09 NOTE — ED Triage Notes (Signed)
Patient in today c/o cough, laryngitis, sinus pain/pressure x 6 days and left eye matted this morning. Patient also c/o abdominal pain/cramps x 2-3 days. Patient denies fever. Patient has tried OTC Catering manager and Mucinex DM.

## 2018-05-09 NOTE — ED Provider Notes (Signed)
[facility] ____________________________________________  Time seen: Approximately 11:14 AM  I have reviewed the triage vital signs and the nursing notes.   HISTORY  Chief Complaint Cough (APPT) and Laryngitis   HPI Peggy Mclaughlin is a 77 y.o. female presenting with son at bedside for evaluation of 6 to 7 days of runny nose, nasal congestion, sinus pressure, postnasal drainage, cough and chest congestion.  States a lot of thick green nasal drainage as well as thick green mucus with coughing.  States sore throat currently is mild to moderate.  Denies any other pain currently.  States does intermittently feel sore from coughing.  Denies chest pain or shortness of breath.  No known fevers.  Did have some left eye matting this morning, but denies eye pain or vision loss.  States both eyes have been watering clear this week.  Son recently sick with a cold.  Did take some over-the-counter Alka-Seltzer earlier this week but no other over-the-counter medication taken.  Overall continues to eat and drink well.  Also reports some suprapubic cramping for the last 2 to 3 days.  However patient and son reports that she often has recurrent lower abdominal pain similarly, and patient states wants to focus on the sinus congestion.  However she also reports recurrent UTIs, will evaluate UTI possibility as well today.  Takes Keflex daily prophylactically for UTIs.  History of renal insufficiency, most recent creatinine 1.1, and reports this is been maintaining.  Denies other recent breakthrough antibiotic use.  Orene Desanctis, MD: PCP   PCP follow-up this coming Monday for same complaints.      Past Medical History:  Diagnosis Date  . Atrophic vaginitis   . Bilateral inguinal hernia without obstruction or gangrene   . Bladder spasms   . Central pain syndrome   . Constipation   . Gait disorder   . Heart palpitations   . High risk medication use   . History of Helicobacter infection     . Hypertension   . Incontinence of urine   . Irregular heart beat   . Lower back pain   . Lumbar facet arthropathy   . Lumbar radicular pain   . Multiple sclerosis (HCC)   . Osteopenia of multiple sites   . Prolonged QT interval   . Schatzki's ring   . Stage 3 chronic kidney disease [facility])     Patient Active Problem List   Diagnosis Date Noted  . Uncontrolled hypertension 04/23/2017  . Headache 04/23/2017  . Depression 04/23/2017  . CKD (chronic kidney disease), stage III (HCC) 04/23/2017    Past Surgical History:  Procedure Laterality Date  . CARPAL TUNNEL RELEASE    . SPINE SURGERY     lumbar and thoracic  . TONSILLECTOMY    . TUBAL LIGATION       No current facility-administered medications for this encounter.   Current Outpatient Medications:  .  acetaminophen (TYLENOL) 500 MG tablet, Take 1 tablet every 8 (eight) hours as needed by mouth., Disp: , Rfl:  .  AZO-CRANBERRY PO, Take 1 tablet by mouth daily., Disp: , Rfl:  .  benazepril (LOTENSIN) 40 MG tablet, Take 40 mg by mouth daily., Disp: , Rfl:  .  carvedilol (COREG) 6.25 MG tablet, Take 1 tablet (6.25 mg total) by mouth 2 (two) times daily., Disp: 60 tablet, Rfl: 0 .  D-Mannose 500 MG CAPS, Take 1 capsule by mouth daily., Disp: , Rfl:  .  diclofenac sodium (VOLTAREN) 1 % GEL, Apply 4  g 3 (three) times daily topically., Disp: , Rfl:  .  estradiol (ESTRACE) 0.1 MG/GM vaginal cream, Place 1 Applicatorful vaginally at bedtime., Disp: , Rfl:  .  FLUoxetine (PROZAC) 20 MG tablet, Take 20 mg 2 (two) times daily by mouth. , Disp: , Rfl:  .  fluticasone (FLONASE) 50 MCG/ACT nasal spray, Place 2 sprays into both nostrils daily., Disp: , Rfl:  .  hydrALAZINE (APRESOLINE) 25 MG tablet, Take 1 tablet (25 mg total) by mouth 3 (three) times daily., Disp: 90 tablet, Rfl: 0 .  HYDROcodone-acetaminophen (NORCO) 10-325 MG tablet, Take 1 tablet 2 (two) times daily as needed by mouth (Pain). , Disp: , Rfl:  .  labetalol (NORMODYNE)  100 MG tablet, Take 1 tablet (100 mg total) by mouth 2 (two) times daily., Disp: 60 tablet, Rfl: 0 .  Probiotic Product (PROBIOTIC-10) CAPS, Take 1 capsule by mouth daily., Disp: , Rfl:  .  spironolactone (ALDACTONE) 25 MG tablet, Take 25 mg by mouth daily., Disp: , Rfl:  .  benzonatate (TESSALON PERLES) 100 MG capsule, Take 1 capsule (100 mg total) by mouth 3 (three) times daily as needed for cough., Disp: 15 capsule, Rfl: 0 .  cephALEXin (KEFLEX) 250 MG capsule, Take 250 mg by mouth daily., Disp: , Rfl: 0 .  doxycycline (VIBRAMYCIN) 100 MG capsule, Take 1 capsule (100 mg total) by mouth 2 (two) times daily., Disp: 20 capsule, Rfl: 0  Allergies Amlodipine; Ampicillin; Sulfa antibiotics; Aspirin; Demerol [meperidine]; Adhesive [tape]; Baclofen; Simvastatin; and Tapentadol  Family History  Problem Relation Age of Onset  . Depression Mother   . Lung cancer Father     Social History Social History   Tobacco Use  . Smoking status: Never Smoker  . Smokeless tobacco: Never Used  Substance Use Topics  . Alcohol use: No  . Drug use: No    Review of Systems Constitutional: No fever Eyes: No visual changes. As above.  ENT As above.  Cardiovascular: Denies chest pain. Respiratory: Denies shortness of breath. Gastrointestinal: No abdominal pain.  LBM 2 days ago described as normal.  Genitourinary: States some cloudy urine. Musculoskeletal: Negative for atypical  back pain. Skin: Negative for rash.   ____________________________________________   PHYSICAL EXAM:  VITAL SIGNS: ED Triage Vitals [05/09/18 1018]  Enc Vitals Group     BP (!) 168/72     Pulse Rate 71     Resp 20     Temp 98.4 F (36.9 C)     Temp Source Oral     SpO2 97 %     Weight 195 lb (88.5 kg)     Height 5\' 4"  (1.626 m)     Head Circumference      Peak Flow      Pain Score 6     Pain Loc      Pain Edu?      Excl. in GC?     Constitutional: Alert and oriented. Well appearing and in no acute  distress. Eyes: Conjunctivae are normal. PERRL. EOMI. Head: Atraumatic.Mild to moderate tenderness to palpation bilateral frontal and maxillary sinuses. No swelling. No erythema.   Ears: no erythema, normal TMs bilaterally.   Nose: nasal congestion with bilateral nasal turbinate erythema and edema.   Mouth/Throat: Mucous membranes are moist.  Oropharynx non-erythematous.No tonsillar swelling or exudate.  Neck: No stridor.  No cervical spine tenderness to palpation. Hematological/Lymphatic/Immunilogical: No cervical lymphadenopathy. Cardiovascular: Normal rate, regular rhythm. Grossly normal heart sounds.  Good peripheral circulation. Respiratory: Normal respiratory effort.  No retractions.No wheezes.  Mild scattered rhonchi.  Dry intermittent cough in room.  Good air movement.  Gastrointestinal: Midline suprapubic tenderness to palpation, minimal bilateral lower abdominal tenderness.  Abdomen otherwise soft and nontender.  Non-guarding.  No CVA tenderness. Neurologic:  Normal speech and language. No gross focal neurologic deficits are appreciated.  Skin:  Skin is warm, dry and intact. No rash noted. Psychiatric: Mood and affect are normal. Speech and behavior are normal.  ___________________________________________   LABS (all labs ordered are listed, but only abnormal results are displayed)  Labs Reviewed - No data to display ____________________________________________   PROCEDURES Procedures    INITIAL IMPRESSION / ASSESSMENT AND PLAN / ED COURSE  Pertinent labs & imaging results that were available during my care of the patient were reviewed by me and considered in my medical decision making (see chart for details).  Overall well-appearing patient.  Family at bedside.  Suspect recent viral illness.  Secondary sinusitis.  Will treat with oral doxycycline.  Counseled regarding closely monitoring abdominal complaints and discussed follow-up and return parameters for the same.  We  will also evaluate urinalysis.  Patient states in urgent care she is unable to provide urine for urinalysis.  Patient states that at this time she would like to stop any further evaluation of urine abdominal complaints and will keep her follow-up appointment on Monday with her primary.  Continue with doxycycline and Tessalon Perles prescription.  Encourage rest, fluids, supportive care.  Discussed strict follow-up and return parameters.  Also cautioned for any worsening complaints proceed to the ER.Discussed indication, risks and benefits of medications with patient.  Discussed follow up with Primary care physician this week. Discussed follow up and return parameters including no resolution or any worsening concerns. Patient verbalized understanding and agreed to plan.   ____________________________________________   FINAL CLINICAL IMPRESSION(S) / ED DIAGNOSES  Final diagnoses:  Acute pansinusitis, recurrence not specified  Cough  Abdominal discomfort     ED Discharge Orders         Ordered    doxycycline (VIBRAMYCIN) 100 MG capsule  2 times daily     05/09/18 1131    benzonatate (TESSALON PERLES) 100 MG capsule  3 times daily PRN     05/09/18 1131           Note: This dictation was prepared with Dragon dictation along with smaller phrase technology. Any transcriptional errors that result from this process are unintentional.         Renford Dills, NP 05/09/18 1214

## 2018-07-09 IMAGING — CT CT HEAD W/O CM
3 series · 15 of 47 positions shown, 18 images · non-contrast
Comparison: None.

CLINICAL DATA: Hypertension over the past 10 days with headache
over the past 3 weeks.

EXAM:
CT HEAD WITHOUT CONTRAST
TECHNIQUE: Contiguous axial images were obtained from the base of the skull
through the vertex without intravenous contrast.

[Series 2: head wo · axial · 0.39mm/px · z∈[-123,+2]mm · 9 of 30 slices shown, 12 images]
[im 3/30  brain]
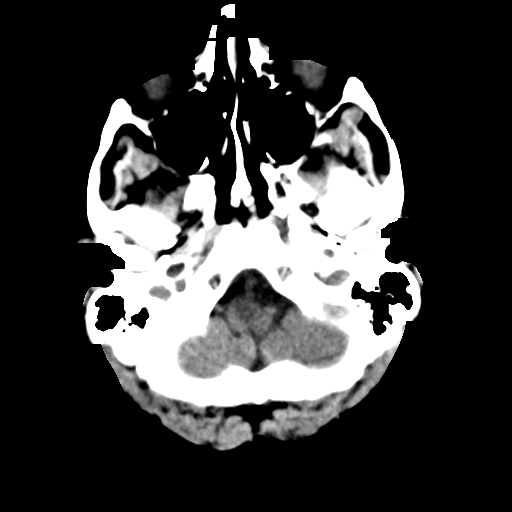
[im 3/30  bone]
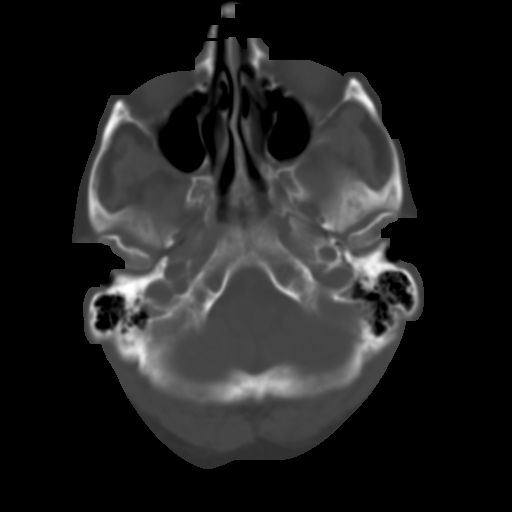
[im 6/30  brain]
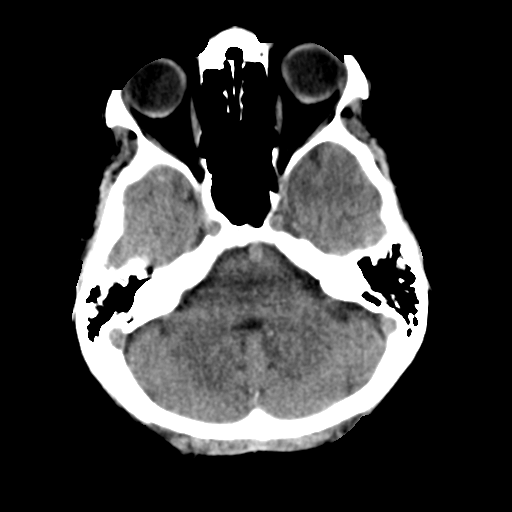
[im 9/30  brain]
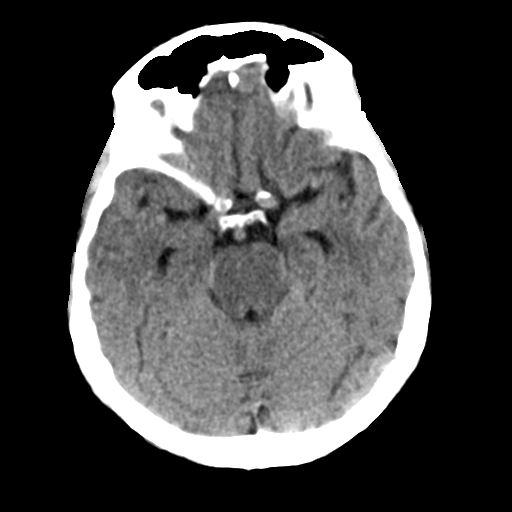
[im 12/30  brain]
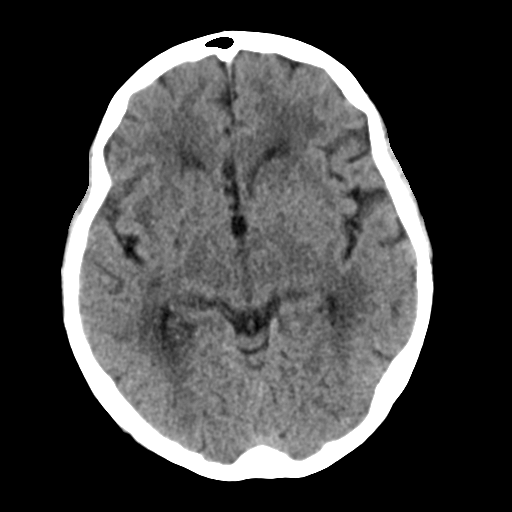
[im 16/30  brain]
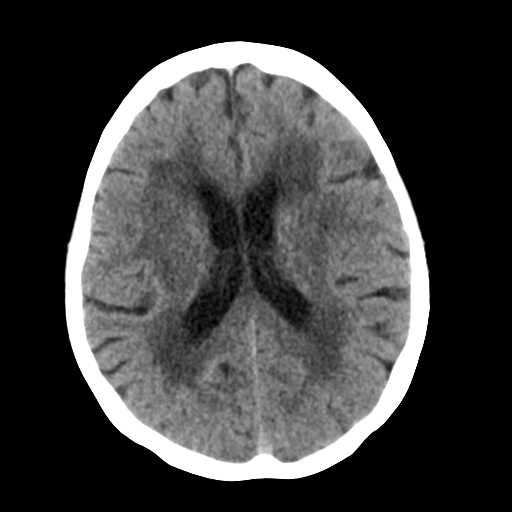
[im 16/30  bone]
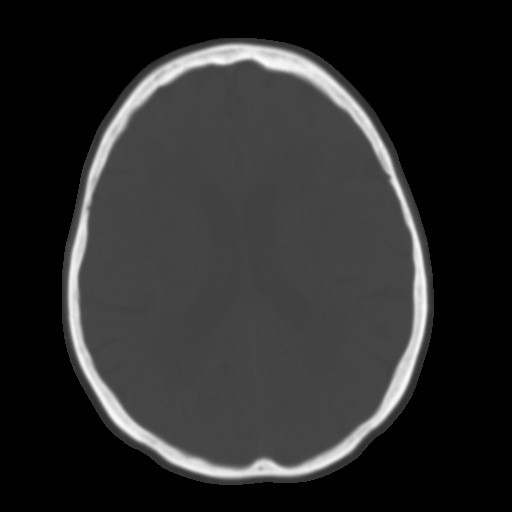
[im 19/30  brain]
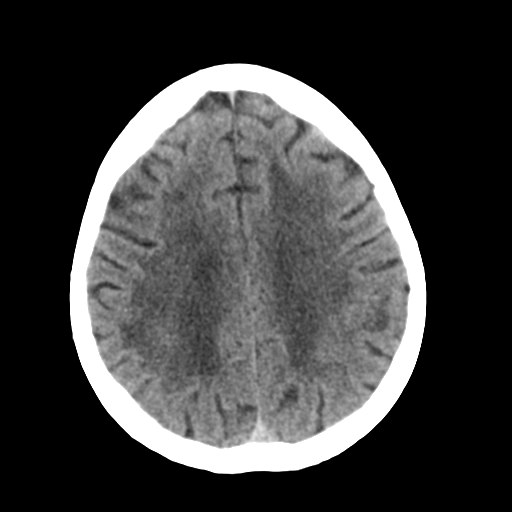
[im 22/30  brain]
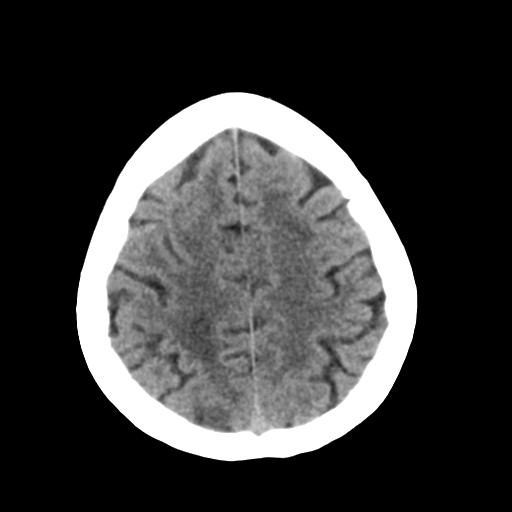
[im 25/30  brain]
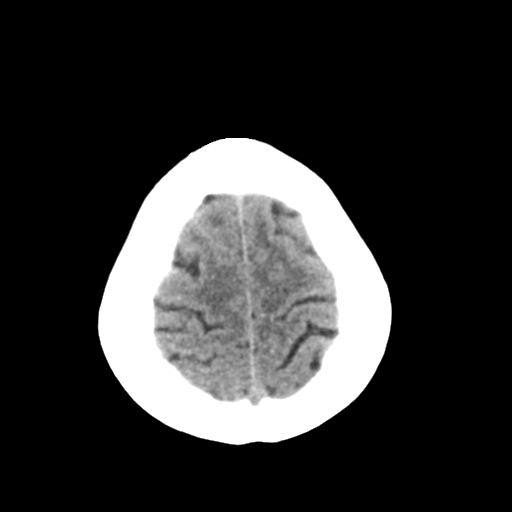
[im 28/30  brain]
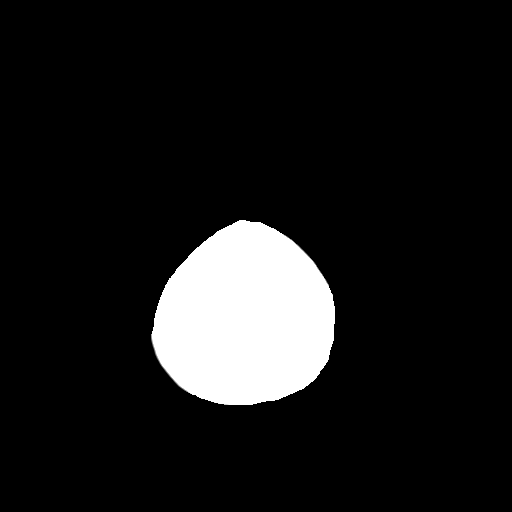
[im 28/30  bone]
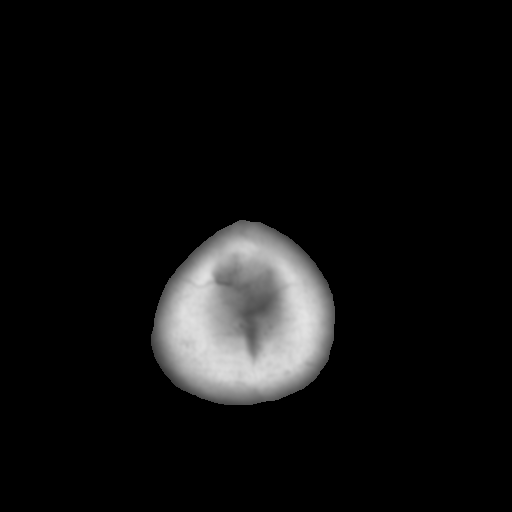

[Series 4: coronal soft tissue · coronal · 0.27mm/px · 3 of 63 slices shown]
[im 21/63  brain]
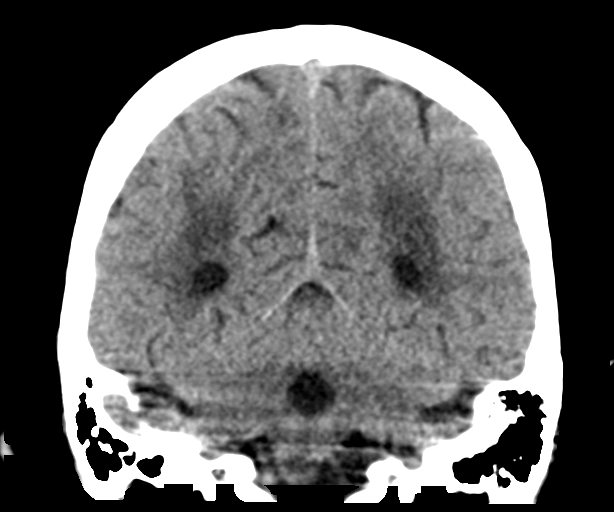
[im 28/63  brain]
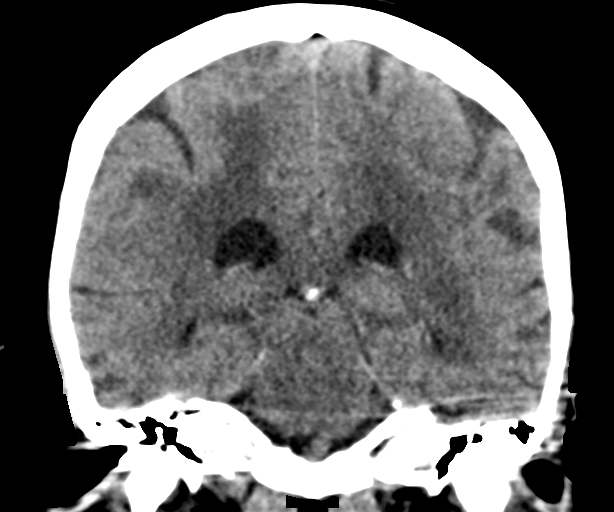
[im 35/63  brain]
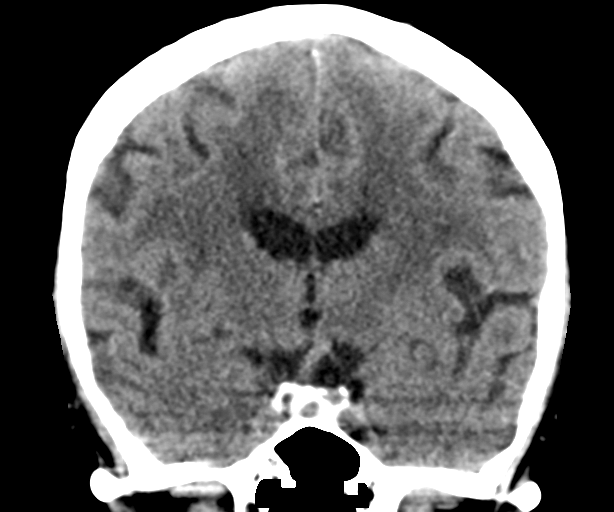

[Series 5: sagittal soft tissue · sagittal · 0.29mm/px · 3 of 49 slices shown]
[im 17/49  brain]
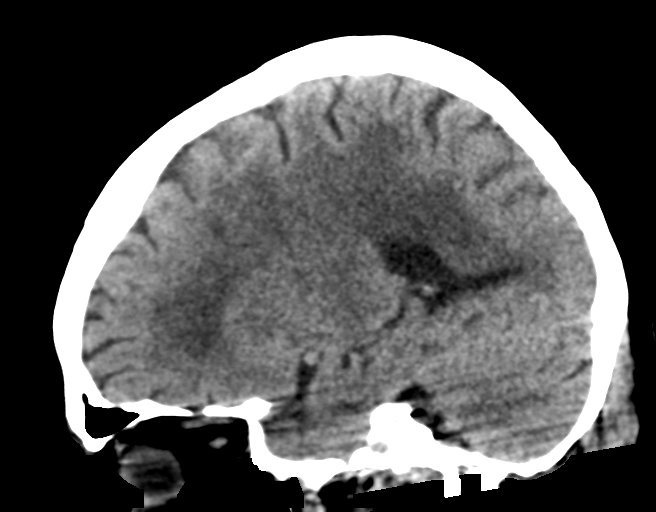
[im 25/49  brain]
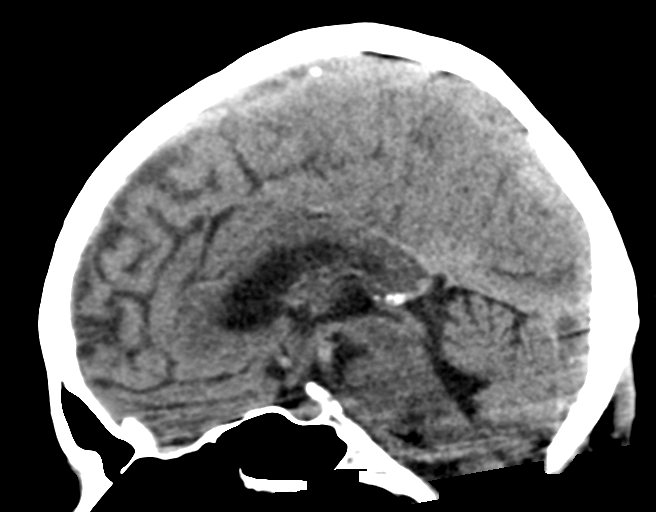
[im 33/49  brain]
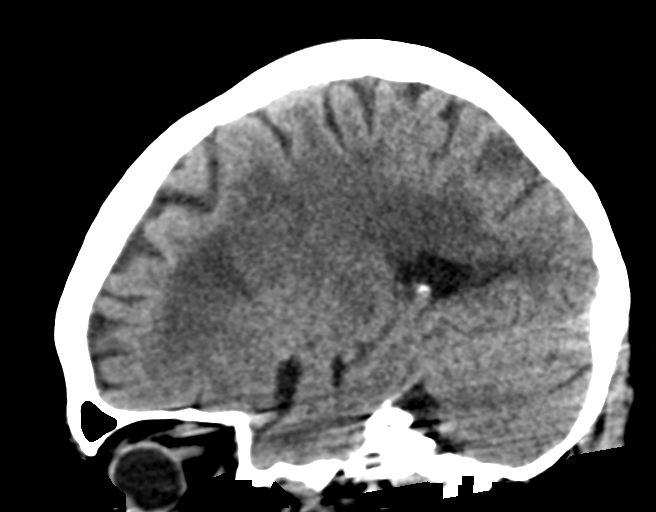

[15 of 47 positions shown; findings below may reference images not displayed]

FINDINGS: Brain: Moderate degree of periventricular, subcortical and deep
white matter hypodensity consistent with chronic microvascular
ischemia. No large vascular territory infarction, hemorrhage or
midline shift. No hydrocephalus. Bilateral basal ganglial and
caudate lacunar infarcts. No effacement of the fourth ventricle nor
basal cisterns. No intra-axial mass.

Vascular: No hyperdense vessels. Moderate atherosclerosis of the
carotid siphons.

Skull: No acute fracture or suspicious osseous lesions.

Sinuses/Orbits: No acute finding.

Other: None.
IMPRESSION: Moderate degree of chronic appearing small vessel ischemia involving
the periventricular and subcortical white matter. No acute
intracranial abnormality.

## 2019-01-30 ENCOUNTER — Ambulatory Visit
Admission: RE | Admit: 2019-01-30 | Discharge: 2019-01-30 | Disposition: A | Discharge status: Home / Self Care | Payer: Medicare Other | Source: Ambulatory Visit | Attending: Family Medicine | Admitting: Family Medicine

## 2019-01-30 ENCOUNTER — Other Ambulatory Visit: Payer: Self-pay | Admitting: Family Medicine

## 2019-01-30 ENCOUNTER — Other Ambulatory Visit: Payer: Self-pay

## 2019-01-30 DIAGNOSIS — R10813 Right lower quadrant abdominal tenderness: Secondary | ICD-10-CM | POA: Diagnosis present

## 2023-04-23 ENCOUNTER — Other Ambulatory Visit: Payer: Self-pay | Admitting: Family Medicine

## 2023-04-23 DIAGNOSIS — M7989 Other specified soft tissue disorders: Secondary | ICD-10-CM
